# Patient Record
Sex: Male | Born: 1965 | Race: Black or African American | Hispanic: No | Marital: Married | Smoking: Current every day smoker
Health system: Southern US, Community
[De-identification: ages and names within clinical notes are randomized; demographics above are authoritative.]

## PROBLEM LIST (undated history)

## (undated) DIAGNOSIS — Z8669 Personal history of other diseases of the nervous system and sense organs: Secondary | ICD-10-CM

## (undated) DIAGNOSIS — N179 Acute kidney failure, unspecified: Secondary | ICD-10-CM

## (undated) DIAGNOSIS — F1410 Cocaine abuse, uncomplicated: Secondary | ICD-10-CM

## (undated) HISTORY — DX: Personal history of other diseases of the nervous system and sense organs: Z86.69

## (undated) HISTORY — DX: Cocaine abuse, uncomplicated: F14.10

## (undated) HISTORY — DX: Acute kidney failure, unspecified: N17.9

---

## 2005-02-25 ENCOUNTER — Emergency Department: Payer: Self-pay | Admitting: Emergency Medicine

## 2007-12-24 ENCOUNTER — Emergency Department: Payer: Self-pay | Admitting: Emergency Medicine

## 2012-09-24 ENCOUNTER — Emergency Department: Payer: Self-pay | Admitting: Emergency Medicine

## 2014-12-24 ENCOUNTER — Emergency Department
Admission: EM | Admit: 2014-12-24 | Discharge: 2014-12-24 | Disposition: A | Discharge status: Home / Self Care | Payer: Self-pay | Attending: Emergency Medicine | Admitting: Emergency Medicine

## 2014-12-24 ENCOUNTER — Encounter: Payer: Self-pay | Admitting: Emergency Medicine

## 2014-12-24 DIAGNOSIS — R0981 Nasal congestion: Secondary | ICD-10-CM

## 2014-12-24 DIAGNOSIS — J329 Chronic sinusitis, unspecified: Secondary | ICD-10-CM

## 2014-12-24 DIAGNOSIS — Z720 Tobacco use: Secondary | ICD-10-CM | POA: Insufficient documentation

## 2014-12-24 MED ORDER — PSEUDOEPHEDRINE HCL ER 120 MG PO TB12: 120.0000 mg | ORAL_TABLET | Freq: Two times a day (BID) | ORAL | Status: DC | PRN

## 2014-12-24 NOTE — Discharge Instructions (Signed)
As discussed please try out a Neti Pot (or similar device) to help clear your sinuses. Please seek medical attention for any high fevers, chest pain, shortness of breath, change in behavior, persistent vomiting, bloody stool or any other new or concerning symptoms.   Sinusitis, Adult Sinusitis is redness, soreness, and inflammation of the paranasal sinuses. Paranasal sinuses are air pockets within the bones of your face. They are located beneath your eyes, in the middle of your forehead, and above your eyes. In healthy paranasal sinuses, mucus is able to drain out, and air is able to circulate through them by way of your nose. However, when your paranasal sinuses are inflamed, mucus and air can become trapped. This can allow bacteria and other germs to grow and cause infection. Sinusitis can develop quickly and last only a short time (acute) or continue over a long period (chronic). Sinusitis that lasts for more than 12 weeks is considered chronic. CAUSES Causes of sinusitis include:  Allergies.  Structural abnormalities, such as displacement of the cartilage that separates your nostrils (deviated septum), which can decrease the air flow through your nose and sinuses and affect sinus drainage.  Functional abnormalities, such as when the small hairs (cilia) that line your sinuses and help remove mucus do not work properly or are not present. SIGNS AND SYMPTOMS Symptoms of acute and chronic sinusitis are the same. The primary symptoms are pain and pressure around the affected sinuses. Other symptoms include:  Upper toothache.  Earache.  Headache.  Bad breath.  Decreased sense of smell and taste.  A cough, which worsens when you are lying flat.  Fatigue.  Fever.  Thick drainage from your nose, which often is green and may contain pus (purulent).  Swelling and warmth over the affected sinuses. DIAGNOSIS Your health care provider will perform a physical exam. During your exam, your  health care provider may perform any of the following to help determine if you have acute sinusitis or chronic sinusitis:  Look in your nose for signs of abnormal growths in your nostrils (nasal polyps).  Tap over the affected sinus to check for signs of infection.  View the inside of your sinuses using an imaging device that has a light attached (endoscope). If your health care provider suspects that you have chronic sinusitis, one or more of the following tests may be recommended:  Allergy tests.  Nasal culture. A sample of mucus is taken from your nose, sent to a lab, and screened for bacteria.  Nasal cytology. A sample of mucus is taken from your nose and examined by your health care provider to determine if your sinusitis is related to an allergy. TREATMENT Most cases of acute sinusitis are related to a viral infection and will resolve on their own within 10 days. Sometimes, medicines are prescribed to help relieve symptoms of both acute and chronic sinusitis. These may include pain medicines, decongestants, nasal steroid sprays, or saline sprays. However, for sinusitis related to a bacterial infection, your health care provider will prescribe antibiotic medicines. These are medicines that will help kill the bacteria causing the infection. Rarely, sinusitis is caused by a fungal infection. In these cases, your health care provider will prescribe antifungal medicine. For some cases of chronic sinusitis, surgery is needed. Generally, these are cases in which sinusitis recurs more than 3 times per year, despite other treatments. HOME CARE INSTRUCTIONS  Drink plenty of water. Water helps thin the mucus so your sinuses can drain more easily.  Use a humidifier.  Inhale steam 3-4 times a day (for example, sit in the bathroom with the shower running).  Apply a warm, moist washcloth to your face 3-4 times a day, or as directed by your health care provider.  Use saline nasal sprays to help  moisten and clean your sinuses.  Take medicines only as directed by your health care provider.  If you were prescribed either an antibiotic or antifungal medicine, finish it all even if you start to feel better. SEEK IMMEDIATE MEDICAL CARE IF:  You have increasing pain or severe headaches.  You have nausea, vomiting, or drowsiness.  You have swelling around your face.  You have vision problems.  You have a stiff neck.  You have difficulty breathing.   This information is not intended to replace advice given to you by your health care provider. Make sure you discuss any questions you have with your health care provider.   Document Released: 02/20/2005 Document Revised: 03/13/2014 Document Reviewed: 03/07/2011 Elsevier Interactive Patient Education [employer]2016 [employer]vier Inc.

## 2014-12-24 NOTE — ED Provider Notes (Signed)
[facility] Emergency Department Provider Note    ____________________________________________  Time seen: 2045  I have reviewed the triage vital signs and the nursing notes.   HISTORY  Chief Complaint Nasal Congestion and Headache   History limited by: Not Limited   HPI Alan Hampton is a 49 y.o. male who presents to the emergency department with concerns for nasal congestion and facial pressure. The patient states that the symptoms started yesterday. They've gradually gotten worse. He states that the pressure is located primarily behind his eyes. It has been a pressure type. It has been constant. He is also complaining of nasal congestion. He denies any fevers. He denies any known sick contacts however states he is in contact with a lot of people at his job.   History reviewed. No pertinent past medical history.  There are no active problems to display for this patient.   History reviewed. No pertinent past surgical history.  No current outpatient prescriptions on file.  Allergies Review of patient's allergies indicates no known allergies.  No family history on file.  Social History Social History  Substance Use Topics  . Smoking status: Current Every Day Smoker -- 0.50 packs/day    Types: Cigarettes  . Smokeless tobacco: None  . Alcohol Use: No    Review of Systems  Constitutional: Negative for fever. Cardiovascular: Negative for chest pain. Respiratory: Negative for shortness of breath. Gastrointestinal: Negative for abdominal pain, vomiting and diarrhea. Genitourinary: Negative for dysuria. Musculoskeletal: Negative for back pain. Skin: Negative for rash. Neurological: Negative for headaches, focal weakness or numbness.  10-point ROS otherwise negative.  ____________________________________________   PHYSICAL EXAM:  VITAL SIGNS: ED Triage Vitals  Enc Vitals Group     BP 12/24/14 2010 114/72 mmHg     Pulse Rate 12/24/14  2010 82     Resp 12/24/14 2010 18     Temp 12/24/14 2010 97.6 F (36.4 C)     Temp Source 12/24/14 2010 Oral     SpO2 12/24/14 2010 97 %     Weight 12/24/14 2010 140 lb (63.504 kg)     Height 12/24/14 2010  (1.778 m)     Head Cir --      Peak Flow --      Pain Score 12/24/14 2010 8   Constitutional: Alert and oriented. Well appearing and in no distress. Eyes: Conjunctivae are normal. PERRL. Normal extraocular movements. ENT   Head: Normocephalic and atraumatic.   Nose: No congestion/rhinnorhea.   Mouth/Throat: Mucous membranes are moist.   Neck: No stridor. Hematological/Lymphatic/Immunilogical: No cervical lymphadenopathy. Cardiovascular: Normal rate, regular rhythm.  No murmurs, rubs, or gallops. Respiratory: Normal respiratory effort without tachypnea nor retractions. Breath sounds are clear and equal bilaterally. No wheezes/rales/rhonchi. Gastrointestinal: Soft and nontender. No distention. Genitourinary: Deferred Musculoskeletal: Normal range of motion in all extremities. No joint effusions.  No lower extremity tenderness nor edema. Neurologic:  Normal speech and language. No gross focal neurologic deficits are appreciated. Speech is normal.  Skin:  Skin is warm, dry and intact. No rash noted. Psychiatric: Mood and affect are normal. Speech and behavior are normal. Patient exhibits appropriate insight and judgment.  ____________________________________________    LABS (pertinent positives/negatives)  None  ____________________________________________   EKG  None  ____________________________________________    RADIOLOGY  None   ____________________________________________   PROCEDURES  Procedure(s) performed: None  Critical Care performed: No  ____________________________________________   INITIAL IMPRESSION / ASSESSMENT AND PLAN / ED COURSE  Pertinent labs &  imaging results that were available during my care of the patient were  reviewed by me and considered in my medical decision making (see chart for details).  Patient presents to the emergency department because of concerns for nasal congestion and pressure. Exam is consistent with sinusitis. I did discuss neti pot and irrigation with the patient. Additionally will give prescription for decongestant.  ____________________________________________   FINAL CLINICAL IMPRESSION(S) / ED DIAGNOSES  Final diagnoses:  Congestion of nasal sinus  Sinusitis, unspecified chronicity, unspecified location     Phineas SemenGraydon Wilhelmine Krogstad, MD 12/24/14 2118

## 2016-06-04 ENCOUNTER — Emergency Department
Admission: EM | Admit: 2016-06-04 | Discharge: 2016-06-04 | Disposition: A | Discharge status: Home / Self Care | Payer: Self-pay | Attending: Emergency Medicine | Admitting: Emergency Medicine

## 2016-06-04 ENCOUNTER — Encounter: Payer: Self-pay | Admitting: Emergency Medicine

## 2016-06-04 DIAGNOSIS — L821 Other seborrheic keratosis: Secondary | ICD-10-CM | POA: Insufficient documentation

## 2016-06-04 DIAGNOSIS — Z79899 Other long term (current) drug therapy: Secondary | ICD-10-CM | POA: Insufficient documentation

## 2016-06-04 DIAGNOSIS — F17210 Nicotine dependence, cigarettes, uncomplicated: Secondary | ICD-10-CM | POA: Insufficient documentation

## 2016-06-04 MED ORDER — TRIAMCINOLONE ACETONIDE 0.5 % EX OINT: 1.0000 "application " | TOPICAL_OINTMENT | Freq: Two times a day (BID) | CUTANEOUS | 0 refills | Status: DC

## 2016-06-04 MED ORDER — DIPHENHYDRAMINE HCL 25 MG PO CAPS: 25.0000 mg | ORAL_CAPSULE | ORAL | 0 refills | Status: DC | PRN

## 2016-06-04 NOTE — ED Provider Notes (Signed)
[facility] Emergency Department Provider Note   ____________________________________________   First MD Initiated Contact with Patient 06/04/16 [phone]     (approximate)  I have reviewed the triage vital signs and the nursing notes.   HISTORY  Chief Complaint Back Pain    HPI Alan Hampton is a 51 y.o. male who comes into the hospital today with an area on his back that won't heal. He reports that he had a tick on his back and he pulled it off. He reports that since then the place is gotten worse. This occurred about 3-4 months ago but the area is still sore and there is a scab that spared. He reports that he's been meaning to come in but he just hadn't. He used some cortisone and some Neosporin on it as well as rubbing alcohol. He reports that the area is dry and it itches really badly. It is not draining but nothing is making healed up completely. Any fevers, nausea, vomiting. He rates pain 5 out of 10 in intensity. This area is in the middle of his back.   History reviewed. No pertinent past medical history.  There are no active problems to display for this patient.   History reviewed. No pertinent surgical history.  Prior to Admission medications   Medication Sig Start Date End Date Taking? Authorizing Provider  diphenhydrAMINE (BENADRYL) 25 mg capsule Take 1 capsule (25 mg total) by mouth every 4 (four) hours as needed. 06/04/16 06/04/17  Rebecka Apley, MD  pseudoephedrine (SUDAFED) 120 MG 12 hr tablet Take 1 tablet (120 mg total) by mouth 2 (two) times daily as needed for congestion. 12/24/14   Phineas Semen, MD  triamcinolone ointment (KENALOG) 0.5 % Apply 1 application topically 2 (two) times daily. 06/04/16   Rebecka Apley, MD    Allergies Patient has no known allergies.  No family history on file.  Social History Social History  Substance Use Topics  . Smoking status: Current Every Day Smoker    Packs/day: 0.50    Types: Cigarettes    . Smokeless tobacco: Not on file  . Alcohol use No    Review of Systems Constitutional: No fever/chills Eyes: No visual changes. ENT: No sore throat. Cardiovascular: Denies chest pain. Respiratory: Denies shortness of breath. Gastrointestinal: No abdominal pain.  No nausea, no vomiting.  No diarrhea.  No constipation. Genitourinary: Negative for dysuria. Musculoskeletal: Negative for back pain. Skin: lesion to mid back Neurological: Negative for headaches, focal weakness or numbness.  10-point ROS otherwise negative.  ____________________________________________   PHYSICAL EXAM:  VITAL SIGNS: ED Triage Vitals  Enc Vitals Group     BP 06/04/16 0657 102/66     Pulse Rate 06/04/16 0657 70     Resp 06/04/16 0657 18     Temp --      Temp src --      SpO2 06/04/16 0657 98 %     Weight 06/04/16 0128 140 lb (63.5 kg)     Height 06/04/16 0128  (1.778 m)     Head Circumference --      Peak Flow --      Pain Score 06/04/16 0128 9     Pain Loc --      Pain Edu? --      Excl. in GC? --     Constitutional: Alert and oriented. Well appearing and in mild distress. Eyes: Conjunctivae are normal. PERRL. EOMI. Head: Atraumatic. Nose: No congestion/rhinnorhea. Mouth/Throat: Mucous membranes  are moist.  Oropharynx non-erythematous. Cardiovascular: Normal rate, regular rhythm. Grossly normal heart sounds.  Good peripheral circulation. Respiratory: Normal respiratory effort.  No retractions. Lungs CTAB. Gastrointestinal: Soft and nontender. No distention. Positive bowel sounds Musculoskeletal: No lower extremity tenderness nor edema.   Neurologic:  Normal speech and language.  Skin:  Scaly papule to mid back with no drainage, no erythema, no fluctuance Psychiatric: Mood and affect are normal. Speech and behavior are normal.  ____________________________________________   LABS (all labs ordered are listed, but only abnormal results are displayed)  Labs Reviewed - No data  to display ____________________________________________  EKG  none ____________________________________________  RADIOLOGY  none ____________________________________________   PROCEDURES  Procedure(s) performed: None  Procedures  Critical Care performed: No  ____________________________________________   INITIAL IMPRESSION / ASSESSMENT AND PLAN / ED COURSE  Pertinent labs & imaging results that were available during my care of the patient were reviewed by me and considered in my medical decision making (see chart for details).  This is a 51 year old male who comes into the hospital today with a lesion to the middle of his back. It is been there for about 3-4 months. Looking at this lesion that appears to be a seborrheic keratosis. It is scaly and the papule. The patient needs to have this biopsied by dermatology as well as removed by them. I will give the patient some Benadryl and I will give him a prescription for some triamcinolone cream but he does need to follow back up with dermatology. The patient otherwise has no other concerns. He'll be discharged home.      ____________________________________________   FINAL CLINICAL IMPRESSION(S) / ED DIAGNOSES  Final diagnoses:  Seborrheic keratoses      NEW MEDICATIONS STARTED DURING THIS VISIT:  New Prescriptions   DIPHENHYDRAMINE (BENADRYL) 25 MG CAPSULE    Take 1 capsule (25 mg total) by mouth every 4 (four) hours as needed.   TRIAMCINOLONE OINTMENT (KENALOG) 0.5 %    Apply 1 application topically 2 (two) times daily.     Note:  This document was prepared using Dragon voice recognition software and may include unintentional dictation errors.    Rebecka Apley, MD 06/04/16 [phone]

## 2016-06-04 NOTE — ED Triage Notes (Signed)
Patient reports having back pain "for a minute" (when ask to explain reports 5-6 months).  Patient states removed a tick from his back back in the summer and it has never healed.

## 2016-06-04 NOTE — Discharge Instructions (Signed)
Please follow up with dermatology. The lesion will need to be biopsied and removed for further evaluation.

## 2016-06-04 NOTE — ED Notes (Signed)
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 Pt denies drainage from the area or fevers. Pt states the "itching and pain keeps getting worse" and "I've tried everything neosporin, hydrocortisone cream and nothing helps." Pt states he is unsure if he removed tick head when he pulled it off. Raised area to pt's central mid back with multiple scratch marks noted.

## 2017-01-08 ENCOUNTER — Emergency Department
Admission: EM | Admit: 2017-01-08 | Discharge: 2017-01-08 | Disposition: A | Discharge status: Home / Self Care | Payer: Self-pay | Attending: Emergency Medicine | Admitting: Emergency Medicine

## 2017-01-08 ENCOUNTER — Emergency Department: Payer: Self-pay

## 2017-01-08 DIAGNOSIS — S56922A Laceration of unspecified muscles, fascia and tendons at forearm level, left arm, initial encounter: Secondary | ICD-10-CM | POA: Insufficient documentation

## 2017-01-08 DIAGNOSIS — Y9389 Activity, other specified: Secondary | ICD-10-CM | POA: Insufficient documentation

## 2017-01-08 DIAGNOSIS — T148XXA Other injury of unspecified body region, initial encounter: Secondary | ICD-10-CM

## 2017-01-08 DIAGNOSIS — S0181XA Laceration without foreign body of other part of head, initial encounter: Secondary | ICD-10-CM

## 2017-01-08 DIAGNOSIS — Y929 Unspecified place or not applicable: Secondary | ICD-10-CM | POA: Insufficient documentation

## 2017-01-08 DIAGNOSIS — F17200 Nicotine dependence, unspecified, uncomplicated: Secondary | ICD-10-CM | POA: Insufficient documentation

## 2017-01-08 DIAGNOSIS — W25XXXA Contact with sharp glass, initial encounter: Secondary | ICD-10-CM | POA: Insufficient documentation

## 2017-01-08 DIAGNOSIS — Z23 Encounter for immunization: Secondary | ICD-10-CM | POA: Insufficient documentation

## 2017-01-08 DIAGNOSIS — Y998 Other external cause status: Secondary | ICD-10-CM | POA: Insufficient documentation

## 2017-01-08 MED ORDER — LIDOCAINE-EPINEPHRINE (PF) 2 %-1:200000 IJ SOLN
10.0000 mL | Freq: Once | INTRAMUSCULAR | Status: DC
  Filled 2017-01-08: qty 10

## 2017-01-08 MED ORDER — DTAP-HEPATITIS B RECOMB-IPV IM SUSP: 0.5000 mL | Freq: Once | INTRAMUSCULAR | Status: DC

## 2017-01-08 MED ORDER — TETANUS-DIPHTH-ACELL PERTUSSIS 5-2.5-18.5 LF-MCG/0.5 IM SUSP
0.5000 mL | Freq: Once | INTRAMUSCULAR | Status: AC
  Administered 2017-01-08: 0.5 mL via INTRAMUSCULAR
  Filled 2017-01-08: qty 0.5

## 2017-01-08 MED ORDER — CEPHALEXIN 500 MG PO CAPS
500.0000 mg | ORAL_CAPSULE | Freq: Once | ORAL | Status: AC
  Administered 2017-01-08: 500 mg via ORAL
  Filled 2017-01-08: qty 1

## 2017-01-08 MED ORDER — CEPHALEXIN 500 MG PO CAPS: 500.0000 mg | ORAL_CAPSULE | Freq: Four times a day (QID) | ORAL | 0 refills | Status: AC

## 2017-01-08 NOTE — ED Triage Notes (Signed)
Patient reports he cut his left arm on screen door. Patient has 3" laceration to left arm. Patient does not remember when his last tetanus shot was.

## 2017-01-08 NOTE — ED Provider Notes (Signed)
[facility] Emergency Department Provider Note   ____________________________________________   None    (approximate)  I have reviewed the triage vital signs and the nursing notes.   HISTORY  Chief Complaint Laceration    HPI Alan Hampton is a 51 y.o. male reports no significant medical history, reports that he was getting his dog in this evening when he got tangled up with the dog, and fell forward and his hand went through the glass on the front of his screen door.  He reports that bled and is painful, he has a cut over the left forearm.  Denies any numbness tingling or weakness in the hand.  Does not take any blood thinners.  Denies any other injury.  Unsure when his last tetanus shot was  Reports a stinging pain, especially in the area where the laceration is.  Denies any numbness or tingling.  History reviewed. No pertinent past medical history.  There are no active problems to display for this patient.   History reviewed. No pertinent surgical history.  Takes no medications  Allergies Patient has no known allergies.  No family history on file.  Social History Social History   Tobacco Use  . Smoking status: Current Every Day Smoker    Packs/day: 0.50    Types: Cigarettes  . Smokeless tobacco: Never Used  Substance Use Topics  . Alcohol use: No  . Drug use: No    Review of Systems Constitutional: No fever/chills Eyes: No visual changes. ENT: No sore throat. Cardiovascular: Denies chest pain. Respiratory: Denies shortness of breath. Gastrointestinal: No abdominal pain.  No nausea, no vomiting.  No diarrhea.  No constipation. Genitourinary: Negative for dysuria. Musculoskeletal: Negative for back pain. Skin: See HPI Neurological: Negative for headaches, focal weakness or numbness.    ____________________________________________   PHYSICAL EXAM:  VITAL SIGNS: ED Triage Vitals  Enc Vitals Group     BP 01/08/17 0249  125/76     Pulse Rate 01/08/17 0249 (!) 102     Resp 01/08/17 0249 17     Temp 01/08/17 0249 98.2 F (36.8 C)     Temp Source 01/08/17 0249 Oral     SpO2 01/08/17 0249 100 %     Weight 01/08/17 0246 140 lb (63.5 kg)     Height 01/08/17 0246 5\' 10"  (1.778 m)     Head Circumference --      Peak Flow --      Pain Score 01/08/17 0246 9     Pain Loc --      Pain Edu? --      Excl. in GC? --     Constitutional: Alert and oriented. Well appearing and in no acute distress. Eyes: Conjunctivae are normal. Head: Atraumatic. Nose: No congestion/rhinnorhea. Mouth/Throat: Mucous membranes are moist. Neck: No stridor.   Cardiovascular: Normal rate, regular rhythm. Grossly normal heart sounds.  Good peripheral circulation. Respiratory: Normal respiratory effort.  No retractions. Lungs CTAB. Gastrointestinal: Soft and nontender. No distention. Musculoskeletal:  Right hand Median, ulnar, radial motor intact. Cap refill less than 2 seconds all digits. Strong radial pulse. 5 out of 5 strength throughout the hand intrinsics, flexion and extension at the wrist.  There is an approximately 3 inch linear laceration without obvious foreign body over the left lateral forearm with bleeding controlled.  Upon ample irrigation, did discover on close examination of the patient does appear to have a muscle body injury with a laceration that is somewhat unclear if it is  through and through over approximately the region of the extensor carpi ulnaris region, extension of the wrist in flexion of the wrist is normal, but the muscle is seen to slide back and forth and may represent a partial injury or felt less likely a complete laceration of the muscle body.   Left hand Median, ulnar, radial motor intact. Cap refill less than 2 seconds all digits. Strong radial pulse. 5 out of 5 strength throughout the hand intrinsics, flexion and extension at the wrist. No evidence of  trauma. ____________________________________________   Neurologic:  Normal speech and language. No gross focal neurologic deficits are appreciated.  Skin:  Skin is warm, dry and intact. No rash noted. Psychiatric: Mood and affect are normal. Speech and behavior are normal.  ____________________________________________   LABS (all labs ordered are listed, but only abnormal results are displayed)  Labs Reviewed - No data to display ____________________________________________  EKG   ____________________________________________  RADIOLOGY  X-ray demonstrates no acute finding or foreign body ____________________________________________   PROCEDURES  Procedure(s) performed: Left forearm laceration  LACERATION REPAIR Performed by: Sharyn Creamer Authorized by: Sharyn Creamer Consent: Verbal consent obtained. Risks and benefits: risks, benefits and alternatives were discussed Consent given by: patient Patient identity confirmed: provided demographic data Prepped and Draped in normal sterile fashion Wound explored  Laceration Location: Left forearm  Laceration Length: 7 cm  No Foreign Bodies seen or palpated  Anesthesia: local infiltration  Local anesthetic: lidocaine with epinephrine  Anesthetic total: 7 ml  Irrigation method: syringe Amount of cleaning: standard  Skin closure: 4-0prolene  Number of sutures: 7  Technique: Complex, required 1 vertical mattress and 6 simple interrupted  Patient tolerance: Patient tolerated the procedure well with no immediate complications.  Procedures  Critical Care performed: Yes, see critical care note(s)  ____________________________________________   INITIAL IMPRESSION / ASSESSMENT AND PLAN / ED COURSE  Pertinent labs & imaging results that were available during my care of the patient were reviewed by me and considered in my medical decision making (see chart for details).  Patient presents for isolated injury of the  left forearm.  He has a linear laceration with intact distal neurovascular and musculoskeletal exam.  Bleeding is well controlled.  Plan to repair, update tetanus,  Clinical Course as of Jan 09 443  Mon Jan 08, 2017  1610 Paged ortho  [MQ]  [phone] Case and care discussed with Dr. Lucius Conn (ortho). Recommend oral antibiotic and follow-up in clinic. Refer to Carmon Ginsberg (Dr. Rosita Kea) recommended by Dr. Rosita Kea.   [MQ]    Clinical Course User Index [MQ] Sharyn Creamer, MD   ----------------------------------------- 3:39 AM on 01/08/2017 -----------------------------------------  [employer] interviewing patient.  Patient reports to me laceration from injury on a glass screen door.  I was not present during the interview with the detective but the detective did ask me about the laceration, not reported it was straight in about 2-1/2 inches to 3 inches in length.  Return precautions and treatment recommendations and follow-up discussed with the patient who is agreeable with the plan.    ____________________________________________   FINAL CLINICAL IMPRESSION(S) / ED DIAGNOSES  Final diagnoses:  Laceration of forehead, initial encounter  Muscle laceration      NEW MEDICATIONS STARTED DURING THIS VISIT:  This SmartLink is deprecated. Use AVSMEDLIST instead to display the medication list for a patient.   Note:  This document was prepared using Dragon voice recognition software and may include unintentional dictation errors.     Sharyn Creamer,  MD 01/08/17 [id number]

## 2017-01-08 NOTE — Discharge Instructions (Signed)
As we discussed, I am suspicious that you have an injury to the muscle underneath your cut. To follow-up closely with orthopedics this week, please follow-up with Dr. Rosita KeaMenz.   You have been seen in the Emergency Department (ED) today for a laceration (cut).  Please keep the cut clean but do not submerge it in the water.  It has been repaired with staples or sutures that will need to be removed in about 10 days. Please follow up with your doctor, an urgent care, or return to the ED for suture removal.  Please use your antibiotics as prescribed.  Please take Tylenol (acetaminophen) or Motrin (ibuprofen) as needed for discomfort as written on the box.   Please follow up with your doctor as soon as possible regarding today's emergent visit.   Return to the ED or call your doctor if you notice any signs of infection such as fever, increased pain, increased redness, pus, or other symptoms that concern you.

## 2017-01-08 NOTE — ED Notes (Signed)
Reports cut his arm on his screen door when it went through the glass while he was dealing with his dog.  Dressing applied in triage no bleeding through at this time.

## 2019-12-22 ENCOUNTER — Other Ambulatory Visit: Payer: Self-pay

## 2019-12-22 ENCOUNTER — Emergency Department: Payer: Self-pay

## 2019-12-22 ENCOUNTER — Encounter: Payer: Self-pay | Admitting: *Deleted

## 2019-12-22 DIAGNOSIS — R4182 Altered mental status, unspecified: Secondary | ICD-10-CM | POA: Insufficient documentation

## 2019-12-22 DIAGNOSIS — F1490 Cocaine use, unspecified, uncomplicated: Secondary | ICD-10-CM | POA: Insufficient documentation

## 2019-12-22 DIAGNOSIS — E7220 Disorder of urea cycle metabolism, unspecified: Secondary | ICD-10-CM | POA: Insufficient documentation

## 2019-12-22 DIAGNOSIS — F17210 Nicotine dependence, cigarettes, uncomplicated: Secondary | ICD-10-CM | POA: Insufficient documentation

## 2019-12-22 DIAGNOSIS — J189 Pneumonia, unspecified organism: Secondary | ICD-10-CM | POA: Insufficient documentation

## 2019-12-22 LAB — BASIC METABOLIC PANEL
Anion gap: 11 (ref 5–15)
BUN: 14 mg/dL (ref 6–20)
CO2: 24 mmol/L (ref 22–32)
Calcium: 8.7 mg/dL — ABNORMAL LOW (ref 8.9–10.3)
Chloride: 106 mmol/L (ref 98–111)
Creatinine, Ser: 1.13 mg/dL (ref 0.61–1.24)
GFR, Estimated: >60 mL/min (ref >60)
Glucose, Bld: 108 mg/dL — ABNORMAL HIGH (ref 70–99)
Potassium: 3.6 mmol/L (ref 3.5–5.1)
Sodium: 141 mmol/L (ref 135–145)

## 2019-12-22 LAB — CBC
HCT: 39.7 % (ref 39.0–52.0)
Hemoglobin: 13.1 g/dL (ref 13.0–17.0)
MCH: 31.2 pg (ref 26.0–34.0)
MCHC: 33 g/dL (ref 30.0–36.0)
MCV: 94.5 fL (ref 80.0–100.0)
Platelets: 188 10*3/uL (ref 150–400)
RBC: 4.2 MIL/uL — ABNORMAL LOW (ref 4.22–5.81)
RDW: 12.6 % (ref 11.5–15.5)
WBC: 7.7 10*3/uL (ref 4.0–10.5)
nRBC: 0 % (ref 0.0–0.2)

## 2019-12-22 LAB — TROPONIN I (HIGH SENSITIVITY): Troponin I (High Sensitivity): 4 ng/L (ref <18)

## 2019-12-22 NOTE — ED Triage Notes (Signed)
Pt to ED reporting lefts sided chest pain starting today. No radiation and no known injury. Slight amount of SOB reported but pt denies feeling increased WOB or cough. No lightheadedness or dizziness. Pt denies cardiac hx. Pt having trouble focusing in triage and appears to befalling asleep while talking to nurse but denies taking medications at home and when asked about behavior pt reports "it is just because of the pain."

## 2019-12-23 ENCOUNTER — Encounter: Payer: Self-pay | Admitting: Radiology

## 2019-12-23 ENCOUNTER — Emergency Department: Payer: Self-pay

## 2019-12-23 ENCOUNTER — Emergency Department
Admission: EM | Admit: 2019-12-23 | Discharge: 2019-12-23 | Disposition: A | Discharge status: Home / Self Care | Payer: Self-pay | Attending: Emergency Medicine | Admitting: Emergency Medicine

## 2019-12-23 DIAGNOSIS — R4182 Altered mental status, unspecified: Secondary | ICD-10-CM

## 2019-12-23 DIAGNOSIS — F1490 Cocaine use, unspecified, uncomplicated: Secondary | ICD-10-CM

## 2019-12-23 DIAGNOSIS — R079 Chest pain, unspecified: Secondary | ICD-10-CM

## 2019-12-23 DIAGNOSIS — J189 Pneumonia, unspecified organism: Secondary | ICD-10-CM

## 2019-12-23 DIAGNOSIS — E7220 Disorder of urea cycle metabolism, unspecified: Secondary | ICD-10-CM

## 2019-12-23 LAB — URINE DRUG SCREEN, QUALITATIVE (ARMC ONLY)
Amphetamines, Ur Screen: NOT DETECTED
Barbiturates, Ur Screen: NOT DETECTED
Benzodiazepine, Ur Scrn: NOT DETECTED
Cannabinoid 50 Ng, Ur ~~LOC~~: NOT DETECTED
Cocaine Metabolite,Ur ~~LOC~~: POSITIVE — AB
MDMA (Ecstasy)Ur Screen: NOT DETECTED
Methadone Scn, Ur: NOT DETECTED
Opiate, Ur Screen: NOT DETECTED
Phencyclidine (PCP) Ur S: NOT DETECTED
Tricyclic, Ur Screen: NOT DETECTED

## 2019-12-23 LAB — HEPATIC FUNCTION PANEL
ALT: 13 U/L (ref 0–44)
AST: 18 U/L (ref 15–41)
Albumin: 4 g/dL (ref 3.5–5.0)
Alkaline Phosphatase: 54 U/L (ref 38–126)
Bilirubin, Direct: <0.1 mg/dL (ref 0.0–0.2)
Total Bilirubin: 0.6 mg/dL (ref 0.3–1.2)
Total Protein: 7.4 g/dL (ref 6.5–8.1)

## 2019-12-23 LAB — BLOOD GAS, ARTERIAL
Acid-base deficit: 1.6 mmol/L (ref 0.0–2.0)
Bicarbonate: 22.9 mmol/L (ref 20.0–28.0)
FIO2: 0.21
O2 Saturation: 97.7 %
Patient temperature: 37
pCO2 arterial: 37 mmHg (ref 32.0–48.0)
pH, Arterial: 7.4 (ref 7.350–7.450)
pO2, Arterial: 100 mmHg (ref 83.0–108.0)

## 2019-12-23 LAB — LIPASE, BLOOD: Lipase: 29 U/L (ref 11–51)

## 2019-12-23 LAB — CK: Total CK: 234 U/L (ref 49–397)

## 2019-12-23 LAB — ETHANOL: Alcohol, Ethyl (B): <10 mg/dL (ref <10)

## 2019-12-23 LAB — AMMONIA: Ammonia: 51 umol/L — ABNORMAL HIGH (ref 9–35)

## 2019-12-23 LAB — TROPONIN I (HIGH SENSITIVITY): Troponin I (High Sensitivity): 2 ng/L (ref <18)

## 2019-12-23 MED ORDER — IOHEXOL 350 MG/ML SOLN
75.0000 mL | Freq: Once | INTRAVENOUS | Status: AC | PRN
  Administered 2019-12-23: 75 mL via INTRAVENOUS

## 2019-12-23 MED ORDER — LACTULOSE 10 GM/15ML PO SOLN
30.0000 g | Freq: Once | ORAL | Status: AC
  Administered 2019-12-23: 30 g via ORAL
  Filled 2019-12-23: qty 60

## 2019-12-23 MED ORDER — SODIUM CHLORIDE 0.9 % IV BOLUS
1000.0000 mL | Freq: Once | INTRAVENOUS | Status: AC
  Administered 2019-12-23: 1000 mL via INTRAVENOUS

## 2019-12-23 MED ORDER — AZITHROMYCIN 250 MG PO TABS: ORAL_TABLET | ORAL | 0 refills | Status: DC

## 2019-12-23 NOTE — Discharge Instructions (Addendum)
Stop using illicit drugs. Take antibiotic as prescribed (ZPack). Follow up with your PCP to recheck ammonia level in 3-5 days. Return to the ER for worsening symptoms, persistent vomiting, difficulty breathing or other concerns.

## 2019-12-23 NOTE — ED Notes (Signed)
Pt girlfriend bedside.

## 2019-12-23 NOTE — ED Notes (Signed)
Pt in and out cathed per EDP verbal order. Pt woke up briefely to explain procedure to pt. Pt agreed to cath.  Mac, RN assisted this RN with in and out. 100 mL urine obtained and sent to lab.

## 2019-12-23 NOTE — ED Notes (Addendum)
Pt sitting in lobby sleeping soundly; does not awaken when name called several times; EDT over to awaken pt and pt does not respond to sternal rub; pt flutters eyes slightly open with 2nd sternal rub; resp even/unlab; pt's aunt enters lobby and requested her assistance with pt; aunt calls pt name loudly and shakes him several times firmly until pt awakens and was able to position self in w/c; taken to triage for reassessment; pt denies any drug or alcohol use, remains sleepy and falls back asleep during reassessment

## 2019-12-23 NOTE — ED Provider Notes (Signed)
[facility] Emergency Department Provider Note   ____________________________________________   First MD Initiated Contact with Patient 12/23/19 0119     (approximate)  I have reviewed the triage vital signs and the nursing notes.   HISTORY  Chief Complaint Chest Pain  Level V caveat: limited by somnolence  HPI Alan Hampton is a 54 y.o. male who presents to the emergency department from home with a chief complaint of left-sided chest pain. Reports onset yesterday. Denies know injury or radiation. Denies fever, cough, sob, abdominal pain, nausea, vomiting or dizziness. Patient denies drug use; states he has not slept. Was extremely difficult to arouse and therefore brought back to a treatment room.     Past Medical History None  There are no problems to display for this patient.   History reviewed. No pertinent surgical history.  Prior to Admission medications   Medication Sig Start Date End Date Taking? Authorizing Provider  azithromycin (ZITHROMAX Z-PAK) 250 MG tablet Take 2 pills day 1, then 1 pill daily until finished 12/23/19   Irean Hong, MD    Allergies Patient has no known allergies.  History reviewed. No pertinent family history.  Social History Social History   Tobacco Use  . Smoking status: Current Every Day Smoker    Packs/day: 0.50    Types: Cigarettes  . Smokeless tobacco: Never Used  Substance Use Topics  . Alcohol use: No  . Drug use: No    Review of Systems  Constitutional: No fever/chills Eyes: No visual changes. ENT: No sore throat. Cardiovascular: Positive for chest pain. Respiratory: Denies shortness of breath. Gastrointestinal: No abdominal pain.  No nausea, no vomiting.  No diarrhea.  No constipation. Genitourinary: Negative for dysuria. Musculoskeletal: Negative for back pain. Skin: Negative for rash. Neurological: Negative for headaches, focal weakness or  numbness.   ____________________________________________   PHYSICAL EXAM:  VITAL SIGNS: ED Triage Vitals  Enc Vitals Group     BP 12/22/19 2121 (!) 139/100     Pulse Rate 12/22/19 2121 (!) 105     Resp 12/22/19 2121 16     Temp 12/22/19 2121 99.6 F (37.6 C)     Temp Source 12/22/19 2121 Oral     SpO2 12/22/19 2121 100 %     Weight 12/22/19 2122 130 lb (59 kg)     Height 12/22/19 2122  (1.778 m)     Head Circumference --      Peak Flow --      Pain Score 12/22/19 2121 10     Pain Loc --      Pain Edu? --      Excl. in GC? --     Constitutional: Alert and oriented.  Somnolent appearing and in no acute distress. Eyes: Conjunctivae are normal. PERRL. EOMI. Head: Atraumatic. Nose: No congestion/rhinnorhea. Mouth/Throat: Mucous membranes are moist.   Neck: No stridor.   Cardiovascular: Normal rate, regular rhythm. Grossly normal heart sounds.  Good peripheral circulation. Respiratory: Normal respiratory effort.  No retractions. Lungs CTAB.  Left lower ribs tender to palpation.  No splinting.  No crepitus. Gastrointestinal: Soft and nontender to light or deep palpation. No distention. No abdominal bruits. No CVA tenderness. Musculoskeletal: No lower extremity tenderness nor edema.  No joint effusions. Neurologic: Somnolent but arousable briefly to voice. No gross focal neurologic deficits are appreciated. MAEx4. Skin:  Skin is warm, dry and intact. No rash noted. Psychiatric: Unable to assess. ____________________________________________   LABS (all labs ordered are listed,  but only abnormal results are displayed)  Labs Reviewed  BASIC METABOLIC PANEL - Abnormal; Notable for the following components:      Result Value   Glucose, Bld 108 (*)    Calcium 8.7 (*)    All other components within normal limits  CBC - Abnormal; Notable for the following components:   RBC 4.20 (*)    All other components within normal limits  URINE DRUG SCREEN, QUALITATIVE ([facility] ONLY) -  Abnormal; Notable for the following components:   Cocaine Metabolite,Ur [place] POSITIVE (*)    All other components within normal limits  AMMONIA - Abnormal; Notable for the following components:   Ammonia 51 (*)    All other components within normal limits  HEPATIC FUNCTION PANEL  LIPASE, BLOOD  ETHANOL  BLOOD GAS, ARTERIAL  CK  TROPONIN I (HIGH SENSITIVITY)  TROPONIN I (HIGH SENSITIVITY)   ____________________________________________  EKG  ED ECG REPORT I, Delaila Nand J, the attending physician, personally viewed and interpreted this ECG.   Date: 12/23/2019  EKG Time: 2117  Rate: 102  Rhythm: sinus tachycardia  Axis: Normal  Intervals:none  ST&T Change: Nonspecific  ____________________________________________  RADIOLOGY I, Giavana Rooke J, personally viewed and evaluated these images (plain radiographs) as part of my medical decision making, as well as reviewing the written report by the radiologist.  ED MD interpretation: No acute cardiopulmonary process; CT head unremarkable, CT chest/abdomen/pelvis demonstrates no PE, possible lingular infectious etiology  Official radiology report(s): DG Chest 2 View  Result Date: 12/22/2019 CLINICAL DATA:  Left-sided chest pain starting today. Mild shortness of breath. EXAM: CHEST - 2 VIEW COMPARISON:  None. FINDINGS: Hyperinflation. Normal heart size and pulmonary vascularity. No focal airspace disease or consolidation in the lungs. No blunting of costophrenic angles. No pneumothorax. Mediastinal contours appear intact. IMPRESSION: No active cardiopulmonary disease. Electronically Signed   By: Burman Nieves M.D.   On: 12/22/2019 22:05   CT Head Wo Contrast  Result Date: 12/23/2019 CLINICAL DATA:  54 year old male with altered mental status. Chest pain. EXAM: CT HEAD WITHOUT CONTRAST TECHNIQUE: Contiguous axial images were obtained from the base of the skull through the vertex without intravenous contrast. COMPARISON:  None. FINDINGS:  Brain: No midline shift, ventriculomegaly, mass effect, evidence of mass lesion, intracranial hemorrhage or evidence of cortically based acute infarction. Gray-white matter differentiation is within normal limits throughout the brain. Cerebral volume is within normal limits for age. Vascular: No suspicious intracranial vascular hyperdensity. Skull: No acute osseous abnormality identified. Sinuses/Orbits: Visualized paranasal sinuses and mastoids are clear. Other: No acute orbit or scalp soft tissue findings. IMPRESSION: Normal for age non contrast CT appearance of the brain. Electronically Signed   By: Odessa Fleming M.D.   On: 12/23/2019 02:45   CT Angio Chest PE W/Cm &/Or Wo Cm  Result Date: 12/23/2019 CLINICAL DATA:  54 year old male with chest and abdominal pain. Concern for pulmonary embolism. EXAM: CT ANGIOGRAPHY CHEST CT ABDOMEN AND PELVIS WITH CONTRAST TECHNIQUE: Multidetector CT imaging of the chest was performed using the standard protocol during bolus administration of intravenous contrast. Multiplanar CT image reconstructions and MIPs were obtained to evaluate the vascular anatomy. Multidetector CT imaging of the abdomen and pelvis was performed using the standard protocol during bolus administration of intravenous contrast. CONTRAST:  29mL OMNIPAQUE IOHEXOL 350 MG/ML SOLN COMPARISON:  Chest radiograph dated 12/22/2019. FINDINGS: Evaluation is limited due to respiratory motion artifact as well as streak artifact caused by patient's arms. CTA CHEST FINDINGS Cardiovascular: There is no cardiomegaly or pericardial effusion.  The thoracic aorta is unremarkable. The origins of the great vessels of the aortic arch appear patent. No pulmonary artery embolus identified. Mediastinum/Nodes: No hilar adenopathy. Subcarinal lymph node measures 15 mm in short axis. The esophagus and the thyroid gland are grossly unremarkable. No mediastinal fluid collection. Lungs/Pleura: There is a focal area of consolidative change in  the lingula, likely infectious or inflammatory in etiology. Underlying mass is not excluded. Clinical correlation and follow-up to resolution recommended. A pulmonary infarct is less likely in the absence of pulmonary embolism. There is no pleural effusion or pneumothorax. The central airways are patent. Musculoskeletal: No chest wall abnormality. No acute or significant osseous findings. Review of the MIP images confirms the above findings. CT ABDOMEN and PELVIS FINDINGS No intra-abdominal free air or free fluid. Hepatobiliary: Subcentimeter hypodense focus in the anterior liver is too small to characterize. The liver is otherwise unremarkable. No intrahepatic biliary ductal dilatation. The gallbladder is contracted. No calcified gallstone. Pancreas: Unremarkable. No pancreatic ductal dilatation or surrounding inflammatory changes. Spleen: Normal in size without focal abnormality. Adrenals/Urinary Tract: The adrenal glands unremarkable. There is no hydronephrosis on either side. There is symmetric enhancement and excretion of contrast by both kidneys. Small bilateral renal hypodense lesions are too small to characterize. The visualized ureters and urinary bladder appear unremarkable. Stomach/Bowel: There is moderate stool throughout the colon. There is no bowel obstruction or active inflammation. The appendix is normal. Vascular/Lymphatic: The abdominal aorta and IVC unremarkable. No portal venous gas. There is no adenopathy. Reproductive: The prostate gland is mildly enlarged. The seminal vesicles are symmetric. No pelvic mass. Other: There is paucity of mesenteric and subcutaneous fat. Musculoskeletal: Degenerative changes of the spine. No acute osseous pathology. Review of the MIP images confirms the above findings. IMPRESSION: 1. No CT evidence of pulmonary embolism or aortic dissection. 2. Focal area of consolidative change in the lingula, likely infectious or inflammatory in etiology. Underlying mass is not  excluded. Clinical correlation and follow-up to resolution recommended. 3. No acute intra-abdominal or pelvic pathology. Electronically Signed   By: Elgie CollardArash  Radparvar M.D.   On: 12/23/2019 02:53   CT Abdomen Pelvis W Contrast  Result Date: 12/23/2019 CLINICAL DATA:  54 year old male with chest and abdominal pain. Concern for pulmonary embolism. EXAM: CT ANGIOGRAPHY CHEST CT ABDOMEN AND PELVIS WITH CONTRAST TECHNIQUE: Multidetector CT imaging of the chest was performed using the standard protocol during bolus administration of intravenous contrast. Multiplanar CT image reconstructions and MIPs were obtained to evaluate the vascular anatomy. Multidetector CT imaging of the abdomen and pelvis was performed using the standard protocol during bolus administration of intravenous contrast. CONTRAST:  75mL OMNIPAQUE IOHEXOL 350 MG/ML SOLN COMPARISON:  Chest radiograph dated 12/22/2019. FINDINGS: Evaluation is limited due to respiratory motion artifact as well as streak artifact caused by patient's arms. CTA CHEST FINDINGS Cardiovascular: There is no cardiomegaly or pericardial effusion. The thoracic aorta is unremarkable. The origins of the great vessels of the aortic arch appear patent. No pulmonary artery embolus identified. Mediastinum/Nodes: No hilar adenopathy. Subcarinal lymph node measures 15 mm in short axis. The esophagus and the thyroid gland are grossly unremarkable. No mediastinal fluid collection. Lungs/Pleura: There is a focal area of consolidative change in the lingula, likely infectious or inflammatory in etiology. Underlying mass is not excluded. Clinical correlation and follow-up to resolution recommended. A pulmonary infarct is less likely in the absence of pulmonary embolism. There is no pleural effusion or pneumothorax. The central airways are patent. Musculoskeletal: No chest wall abnormality. No  acute or significant osseous findings. Review of the MIP images confirms the above findings. CT ABDOMEN  and PELVIS FINDINGS No intra-abdominal free air or free fluid. Hepatobiliary: Subcentimeter hypodense focus in the anterior liver is too small to characterize. The liver is otherwise unremarkable. No intrahepatic biliary ductal dilatation. The gallbladder is contracted. No calcified gallstone. Pancreas: Unremarkable. No pancreatic ductal dilatation or surrounding inflammatory changes. Spleen: Normal in size without focal abnormality. Adrenals/Urinary Tract: The adrenal glands unremarkable. There is no hydronephrosis on either side. There is symmetric enhancement and excretion of contrast by both kidneys. Small bilateral renal hypodense lesions are too small to characterize. The visualized ureters and urinary bladder appear unremarkable. Stomach/Bowel: There is moderate stool throughout the colon. There is no bowel obstruction or active inflammation. The appendix is normal. Vascular/Lymphatic: The abdominal aorta and IVC unremarkable. No portal venous gas. There is no adenopathy. Reproductive: The prostate gland is mildly enlarged. The seminal vesicles are symmetric. No pelvic mass. Other: There is paucity of mesenteric and subcutaneous fat. Musculoskeletal: Degenerative changes of the spine. No acute osseous pathology. Review of the MIP images confirms the above findings. IMPRESSION: 1. No CT evidence of pulmonary embolism or aortic dissection. 2. Focal area of consolidative change in the lingula, likely infectious or inflammatory in etiology. Underlying mass is not excluded. Clinical correlation and follow-up to resolution recommended. 3. No acute intra-abdominal or pelvic pathology. Electronically Signed   By: Elgie Collard M.D.   On: 12/23/2019 02:53    ____________________________________________   PROCEDURES  Procedure(s) performed (including Critical Care):  .1-3 Lead EKG Interpretation Performed by: Irean Hong, MD Authorized by: Irean Hong, MD     Interpretation: normal     ECG rate:   95   ECG rate assessment: normal     Rhythm: sinus rhythm     Ectopy: none     Conduction: normal   Comments:     Patient placed on cardiac monitor to evaluate for arrythmias     ____________________________________________   INITIAL IMPRESSION / ASSESSMENT AND PLAN / ED COURSE  As part of my medical decision making, I reviewed the following data within the electronic MEDICAL RECORD NUMBER History obtained from family, Nursing notes reviewed and incorporated, Labs reviewed, EKG interpreted, Old chart reviewed, Radiograph reviewed and Notes from prior ED visits     54 year old male presenting with left-sided chest pain; somnolent and difficult to arouse. Differential diagnosis includes, but is not limited to, alcohol, illicit or prescription medications, or other toxic ingestion; intracranial pathology such as stroke or intracerebral hemorrhage; fever or infectious causes including sepsis; hypoxemia and/or hypercarbia; uremia; trauma; endocrine related disorders such as diabetes, hypoglycemia, and thyroid-related diseases; hypertensive encephalopathy; etc.  Initial troponin and EKG unremarkable. Patient awoke briefly to voice to answers questions, now back asleep. Will add toxicological work-up including EtOH, UDS, ammonia. Obtain CT head, chest/abd pel. Initiate IV fluid hydration and reassess.   Clinical Course as of Dec 22 549  Tue Dec 23, 2019  0235 Girlfriend at bedside who endorses patient's drug use. UDS positive for cocaine. Noted mildly elevated ammonia which is likely not the sole contributory reason for patient's somnolence; will administer Lactulose. Awaiting CT scan results.   [JS]  [phone] Girlfriend able to wake patient up briefly by tickling his beard.  Again suspect somnolence secondary to coming off of cocaine use.  Will continue to monitor in the ED until patient is more awake.   [JS]  0550 Patient awake, took lactulose.  Used urinal, asking for a drink.  Updated him of all  test and imaging results.  Will discharge home with azithromycin.  Strict return precautions given.  Patient and girlfriend verbalized understanding agree with plan of care.   [JS]    Clinical Course User Index [JS] Irean Hong, MD     ____________________________________________   FINAL CLINICAL IMPRESSION(S) / ED DIAGNOSES  Final diagnoses:  Nonspecific chest pain  Altered mental status, unspecified altered mental status type  Cocaine use  Hyperammonemia (HCC)  Community acquired pneumonia, unspecified laterality     ED Discharge Orders         Ordered    azithromycin (ZITHROMAX Z-PAK) 250 MG tablet        12/23/19 0444          *Please note:  Alan Hampton was evaluated in Emergency Department on 12/23/2019 for the symptoms described in the history of present illness. He was evaluated in the context of the global COVID-19 pandemic, which necessitated consideration that the patient might be at risk for infection with the SARS-CoV-2 virus that causes COVID-19. Institutional protocols and algorithms that pertain to the evaluation of patients at risk for COVID-19 are in a state of rapid change based on information released by regulatory bodies including the CDC and federal and state organizations. These policies and algorithms were followed during the patient's care in the ED.  Some ED evaluations and interventions may be delayed as a result of limited staffing during and the pandemic.*   Note:  This document was prepared using Dragon voice recognition software and may include unintentional dictation errors.   Irean Hong, MD 12/23/19 [phone]

## 2020-06-07 ENCOUNTER — Emergency Department: Payer: Self-pay

## 2020-06-07 ENCOUNTER — Other Ambulatory Visit: Payer: Self-pay

## 2020-06-07 ENCOUNTER — Observation Stay
Admission: EM | Admit: 2020-06-07 | Discharge: 2020-06-09 | Disposition: A | Discharge status: Home / Self Care | Payer: Self-pay | Attending: Internal Medicine | Admitting: Internal Medicine

## 2020-06-07 DIAGNOSIS — N179 Acute kidney failure, unspecified: Secondary | ICD-10-CM | POA: Insufficient documentation

## 2020-06-07 DIAGNOSIS — R6510 Systemic inflammatory response syndrome (SIRS) of non-infectious origin without acute organ dysfunction: Secondary | ICD-10-CM

## 2020-06-07 DIAGNOSIS — Z20822 Contact with and (suspected) exposure to covid-19: Secondary | ICD-10-CM | POA: Insufficient documentation

## 2020-06-07 DIAGNOSIS — G9340 Encephalopathy, unspecified: Secondary | ICD-10-CM

## 2020-06-07 DIAGNOSIS — Y900 Blood alcohol level of less than 20 mg/100 ml: Secondary | ICD-10-CM | POA: Insufficient documentation

## 2020-06-07 DIAGNOSIS — F17210 Nicotine dependence, cigarettes, uncomplicated: Secondary | ICD-10-CM | POA: Insufficient documentation

## 2020-06-07 DIAGNOSIS — G9341 Metabolic encephalopathy: Secondary | ICD-10-CM | POA: Insufficient documentation

## 2020-06-07 DIAGNOSIS — A419 Sepsis, unspecified organism: Principal | ICD-10-CM | POA: Insufficient documentation

## 2020-06-07 DIAGNOSIS — Z28310 Unvaccinated for covid-19: Secondary | ICD-10-CM | POA: Insufficient documentation

## 2020-06-07 DIAGNOSIS — F1490 Cocaine use, unspecified, uncomplicated: Secondary | ICD-10-CM

## 2020-06-07 DIAGNOSIS — F1420 Cocaine dependence, uncomplicated: Secondary | ICD-10-CM | POA: Insufficient documentation

## 2020-06-07 DIAGNOSIS — R509 Fever, unspecified: Secondary | ICD-10-CM

## 2020-06-07 LAB — COMPREHENSIVE METABOLIC PANEL
ALT: 15 U/L (ref 0–44)
AST: 21 U/L (ref 15–41)
Albumin: 3.6 g/dL (ref 3.5–5.0)
Alkaline Phosphatase: 52 U/L (ref 38–126)
Anion gap: 8 (ref 5–15)
BUN: 15 mg/dL (ref 6–20)
CO2: 20 mmol/L — ABNORMAL LOW (ref 22–32)
Calcium: 8.5 mg/dL — ABNORMAL LOW (ref 8.9–10.3)
Chloride: 105 mmol/L (ref 98–111)
Creatinine, Ser: 1.51 mg/dL — ABNORMAL HIGH (ref 0.61–1.24)
GFR, Estimated: 55 mL/min — ABNORMAL LOW (ref >60)
Glucose, Bld: 125 mg/dL — ABNORMAL HIGH (ref 70–99)
Potassium: 4.2 mmol/L (ref 3.5–5.1)
Sodium: 133 mmol/L — ABNORMAL LOW (ref 135–145)
Total Bilirubin: 1.3 mg/dL — ABNORMAL HIGH (ref 0.3–1.2)
Total Protein: 7.8 g/dL (ref 6.5–8.1)

## 2020-06-07 LAB — CBC
HCT: 39.6 % (ref 39.0–52.0)
Hemoglobin: 13.2 g/dL (ref 13.0–17.0)
MCH: 30.8 pg (ref 26.0–34.0)
MCHC: 33.3 g/dL (ref 30.0–36.0)
MCV: 92.3 fL (ref 80.0–100.0)
Platelets: 276 10*3/uL (ref 150–400)
RBC: 4.29 MIL/uL (ref 4.22–5.81)
RDW: 12.1 % (ref 11.5–15.5)
WBC: 18.2 10*3/uL — ABNORMAL HIGH (ref 4.0–10.5)
nRBC: 0 % (ref 0.0–0.2)

## 2020-06-07 LAB — CBG MONITORING, ED: Glucose-Capillary: 128 mg/dL — ABNORMAL HIGH (ref 70–99)

## 2020-06-07 LAB — TROPONIN I (HIGH SENSITIVITY): Troponin I (High Sensitivity): 4 ng/L (ref <18)

## 2020-06-07 LAB — AMMONIA: Ammonia: 32 umol/L (ref 9–35)

## 2020-06-07 LAB — ETHANOL: Alcohol, Ethyl (B): <10 mg/dL (ref <10)

## 2020-06-07 MED ORDER — ACETAMINOPHEN 650 MG RE SUPP
650.0000 mg | Freq: Once | RECTAL | Status: AC
  Administered 2020-06-07: 650 mg via RECTAL
  Filled 2020-06-07: qty 1

## 2020-06-07 MED ORDER — AMMONIA AROMATIC IN INHA
1.0000 | Freq: Once | RESPIRATORY_TRACT | Status: AC
  Administered 2020-06-07: 1 via RESPIRATORY_TRACT
  Filled 2020-06-07: qty 10

## 2020-06-07 MED ORDER — LACTATED RINGERS IV BOLUS
1000.0000 mL | Freq: Once | INTRAVENOUS | Status: AC
  Administered 2020-06-07: 1000 mL via INTRAVENOUS

## 2020-06-07 MED ORDER — VANCOMYCIN HCL IN DEXTROSE 1-5 GM/200ML-% IV SOLN
1000.0000 mg | Freq: Once | INTRAVENOUS | Status: AC
  Administered 2020-06-08: 1000 mg via INTRAVENOUS
  Filled 2020-06-07: qty 200

## 2020-06-07 MED ORDER — SODIUM CHLORIDE 0.9 % IV SOLN
2.0000 g | Freq: Once | INTRAVENOUS | Status: AC
  Administered 2020-06-08: 2 g via INTRAVENOUS
  Filled 2020-06-07: qty 2

## 2020-06-07 NOTE — ED Triage Notes (Signed)
Pt in with co lethargy since yesterday, family states he "just gets tired". Pt has hx of the same and was dx with pneumonia. Pt denies any complaints at this tired, states "just sleepy".

## 2020-06-08 ENCOUNTER — Observation Stay: Payer: Self-pay

## 2020-06-08 ENCOUNTER — Encounter: Payer: Self-pay | Admitting: Internal Medicine

## 2020-06-08 ENCOUNTER — Emergency Department: Payer: Self-pay

## 2020-06-08 DIAGNOSIS — A419 Sepsis, unspecified organism: Secondary | ICD-10-CM | POA: Diagnosis present

## 2020-06-08 DIAGNOSIS — G9341 Metabolic encephalopathy: Secondary | ICD-10-CM

## 2020-06-08 DIAGNOSIS — N179 Acute kidney failure, unspecified: Secondary | ICD-10-CM

## 2020-06-08 DIAGNOSIS — F1490 Cocaine use, unspecified, uncomplicated: Secondary | ICD-10-CM

## 2020-06-08 LAB — CBC WITH DIFFERENTIAL/PLATELET
Abs Immature Granulocytes: 0.27 10*3/uL — ABNORMAL HIGH (ref 0.00–0.07)
Basophils Absolute: 0.1 10*3/uL (ref 0.0–0.1)
Basophils Relative: 0 %
Eosinophils Absolute: 0 10*3/uL (ref 0.0–0.5)
Eosinophils Relative: 0 %
HCT: 42.8 % (ref 39.0–52.0)
Hemoglobin: 13.9 g/dL (ref 13.0–17.0)
Immature Granulocytes: 1 %
Lymphocytes Relative: 5 %
Lymphs Abs: 1.1 10*3/uL (ref 0.7–4.0)
MCH: 30.4 pg (ref 26.0–34.0)
MCHC: 32.5 g/dL (ref 30.0–36.0)
MCV: 93.7 fL (ref 80.0–100.0)
Monocytes Absolute: 2 10*3/uL — ABNORMAL HIGH (ref 0.1–1.0)
Monocytes Relative: 10 %
Neutro Abs: 17.6 10*3/uL — ABNORMAL HIGH (ref 1.7–7.7)
Neutrophils Relative %: 84 %
Platelets: 251 10*3/uL (ref 150–400)
RBC: 4.57 MIL/uL (ref 4.22–5.81)
RDW: 12.1 % (ref 11.5–15.5)
Smear Review: NORMAL
WBC: 21 10*3/uL — ABNORMAL HIGH (ref 4.0–10.5)
nRBC: 0 % (ref 0.0–0.2)

## 2020-06-08 LAB — CBC
HCT: 36.2 % — ABNORMAL LOW (ref 39.0–52.0)
Hemoglobin: 11.9 g/dL — ABNORMAL LOW (ref 13.0–17.0)
MCH: 30.7 pg (ref 26.0–34.0)
MCHC: 32.9 g/dL (ref 30.0–36.0)
MCV: 93.3 fL (ref 80.0–100.0)
Platelets: 251 10*3/uL (ref 150–400)
RBC: 3.88 MIL/uL — ABNORMAL LOW (ref 4.22–5.81)
RDW: 12.3 % (ref 11.5–15.5)
WBC: 21.9 10*3/uL — ABNORMAL HIGH (ref 4.0–10.5)
nRBC: 0 % (ref 0.0–0.2)

## 2020-06-08 LAB — RESP PANEL BY RT-PCR (FLU A&B, COVID) ARPGX2
Influenza A by PCR: NEGATIVE
Influenza B by PCR: NEGATIVE
SARS Coronavirus 2 by RT PCR: NEGATIVE

## 2020-06-08 LAB — RESPIRATORY PANEL BY PCR

## 2020-06-08 LAB — CSF CELL COUNT WITH DIFFERENTIAL
Eosinophils, CSF: 0 %
Lymphs, CSF: 94 %
Monocyte-Macrophage-Spinal Fluid: 6 %
RBC Count, CSF: 33 /mm3 — ABNORMAL HIGH (ref 0–3)
Segmented Neutrophils-CSF: 0 %
Tube #: 3
WBC, CSF: 5 /mm3 (ref 0–5)

## 2020-06-08 LAB — URINE DRUG SCREEN, QUALITATIVE (ARMC ONLY)
Amphetamines, Ur Screen: NOT DETECTED
Barbiturates, Ur Screen: NOT DETECTED
Benzodiazepine, Ur Scrn: NOT DETECTED
Cannabinoid 50 Ng, Ur ~~LOC~~: POSITIVE — AB
Cocaine Metabolite,Ur ~~LOC~~: POSITIVE — AB
MDMA (Ecstasy)Ur Screen: NOT DETECTED
Methadone Scn, Ur: NOT DETECTED
Opiate, Ur Screen: NOT DETECTED
Phencyclidine (PCP) Ur S: NOT DETECTED
Tricyclic, Ur Screen: NOT DETECTED

## 2020-06-08 LAB — LACTIC ACID, PLASMA: Lactic Acid, Venous: 1.9 mmol/L (ref 0.5–1.9)

## 2020-06-08 LAB — PROTIME-INR
INR: 1.2 (ref 0.8–1.2)
Prothrombin Time: 15.1 seconds (ref 11.4–15.2)

## 2020-06-08 LAB — URINALYSIS, ROUTINE W REFLEX MICROSCOPIC
Bacteria, UA: NONE SEEN
Bilirubin Urine: NEGATIVE
Glucose, UA: NEGATIVE mg/dL
Ketones, ur: 5 mg/dL — AB
Leukocytes,Ua: NEGATIVE
Nitrite: NEGATIVE
Protein, ur: 100 mg/dL — AB
Specific Gravity, Urine: 1.03 (ref 1.005–1.030)
pH: 5 (ref 5.0–8.0)

## 2020-06-08 LAB — BASIC METABOLIC PANEL
Anion gap: 7 (ref 5–15)
BUN: 16 mg/dL (ref 6–20)
CO2: 22 mmol/L (ref 22–32)
Calcium: 8.5 mg/dL — ABNORMAL LOW (ref 8.9–10.3)
Chloride: 108 mmol/L (ref 98–111)
Creatinine, Ser: 1.3 mg/dL — ABNORMAL HIGH (ref 0.61–1.24)
GFR, Estimated: >60 mL/min (ref >60)
Glucose, Bld: 117 mg/dL — ABNORMAL HIGH (ref 70–99)
Potassium: 4.2 mmol/L (ref 3.5–5.1)
Sodium: 137 mmol/L (ref 135–145)

## 2020-06-08 LAB — HIV ANTIBODY (ROUTINE TESTING W REFLEX): HIV Screen 4th Generation wRfx: NONREACTIVE

## 2020-06-08 LAB — PROTEIN, CSF: Total  Protein, CSF: 89 mg/dL — ABNORMAL HIGH (ref 15–45)

## 2020-06-08 LAB — HEPATITIS C ANTIBODY: HCV Ab: NONREACTIVE

## 2020-06-08 LAB — CORTISOL-AM, BLOOD: Cortisol - AM: 12.5 ug/dL (ref 6.7–22.6)

## 2020-06-08 LAB — PROCALCITONIN: Procalcitonin: 6.18 ng/mL

## 2020-06-08 LAB — TROPONIN I (HIGH SENSITIVITY): Troponin I (High Sensitivity): 3 ng/L (ref <18)

## 2020-06-08 LAB — GLUCOSE, CSF: Glucose, CSF: 67 mg/dL (ref 40–70)

## 2020-06-08 MED ORDER — VANCOMYCIN HCL 1250 MG/250ML IV SOLN: 1250.0000 mg | INTRAVENOUS | Status: DC

## 2020-06-08 MED ORDER — VANCOMYCIN HCL 750 MG/150ML IV SOLN
750.0000 mg | Freq: Two times a day (BID) | INTRAVENOUS | Status: DC
  Filled 2020-06-08: qty 150

## 2020-06-08 MED ORDER — ACETAMINOPHEN 325 MG PO TABS: 650.0000 mg | ORAL_TABLET | Freq: Four times a day (QID) | ORAL | Status: DC | PRN

## 2020-06-08 MED ORDER — ENOXAPARIN SODIUM 40 MG/0.4ML ~~LOC~~ SOLN: 40.0000 mg | SUBCUTANEOUS | Status: DC

## 2020-06-08 MED ORDER — ONDANSETRON HCL 4 MG PO TABS: 4.0000 mg | ORAL_TABLET | Freq: Four times a day (QID) | ORAL | Status: DC | PRN

## 2020-06-08 MED ORDER — VANCOMYCIN HCL 750 MG IV SOLR
750.0000 mg | Freq: Two times a day (BID) | INTRAVENOUS | Status: DC
  Administered 2020-06-08 – 2020-06-09 (×2): 750 mg via INTRAVENOUS
  Filled 2020-06-08 (×4): qty 750

## 2020-06-08 MED ORDER — ONDANSETRON HCL 4 MG/2ML IJ SOLN: 4.0000 mg | Freq: Four times a day (QID) | INTRAMUSCULAR | Status: DC | PRN

## 2020-06-08 MED ORDER — ENOXAPARIN SODIUM 40 MG/0.4ML ~~LOC~~ SOLN
40.0000 mg | SUBCUTANEOUS | Status: DC
  Administered 2020-06-08: 11:00:00 40 mg via SUBCUTANEOUS

## 2020-06-08 MED ORDER — SODIUM CHLORIDE 0.9 % IV SOLN
2.0000 g | Freq: Three times a day (TID) | INTRAVENOUS | Status: DC
  Filled 2020-06-08 (×2): qty 2

## 2020-06-08 MED ORDER — SODIUM CHLORIDE 0.9 % IV SOLN: 2.0000 g | Freq: Two times a day (BID) | INTRAVENOUS | Status: DC

## 2020-06-08 MED ORDER — SODIUM CHLORIDE 0.9 % IV SOLN
2.0000 g | Freq: Two times a day (BID) | INTRAVENOUS | Status: DC
  Administered 2020-06-08: 2 g via INTRAVENOUS
  Filled 2020-06-08 (×2): qty 20

## 2020-06-08 MED ORDER — ACETAMINOPHEN 650 MG RE SUPP: 650.0000 mg | Freq: Four times a day (QID) | RECTAL | Status: DC | PRN

## 2020-06-08 MED ORDER — VANCOMYCIN HCL 500 MG/100ML IV SOLN
500.0000 mg | Freq: Once | INTRAVENOUS | Status: AC
  Administered 2020-06-08: 05:00:00 500 mg via INTRAVENOUS
  Filled 2020-06-08: qty 100

## 2020-06-08 MED ORDER — SODIUM CHLORIDE (PF) 0.9 % IJ SOLN
10.0000 mL | Freq: Once | INTRAMUSCULAR | Status: AC
  Administered 2020-06-08: 10 mL

## 2020-06-08 MED ORDER — SODIUM CHLORIDE 0.9 % IV SOLN
2.0000 g | INTRAVENOUS | Status: DC
  Filled 2020-06-08: qty 20

## 2020-06-08 MED ORDER — METRONIDAZOLE IN NACL 5-0.79 MG/ML-% IV SOLN
500.0000 mg | Freq: Three times a day (TID) | INTRAVENOUS | Status: DC
  Administered 2020-06-08 – 2020-06-09 (×5): 500 mg via INTRAVENOUS
  Filled 2020-06-08 (×8): qty 100

## 2020-06-08 MED ORDER — SODIUM CHLORIDE 0.9 % IV SOLN: INTRAVENOUS | Status: DC

## 2020-06-08 MED ORDER — LIDOCAINE HCL (PF) 1 % IJ SOLN
15.0000 mL | Freq: Once | INTRAMUSCULAR | Status: AC
  Administered 2020-06-08: 11:00:00 15 mL
  Filled 2020-06-08: qty 15

## 2020-06-08 NOTE — Progress Notes (Signed)
Pharmacy Antibiotic Note  Alan Hampton is a 55 y.o. male admitted on 06/07/2020 with sepsis from unknown source.  Pharmacy has been consulted for Cefepime and Vancomycin dosing. Pt also ordered Flagyl 500 mg IV q8h  Plan: Pt SCr currently 1.51 (CrCl 51.4), likely AKI.  AM labs already ordered for 06/08/20.  Since pt given initial doses of both meds in ER, will ordered additional dose of Vancomycin 500 mg per pt wt 63.5kg once, then recheck SCr prior to dosing/ordering scheduled Cefepime and Vancomycin.  0405 0513 SCr Improved to 1.3, CrCl now ~ 60 (59.7)  Due to improvement in CrCl, ordering Cefepime 2 gm q8h per indication and renal fxn.  Vancomycin: Pt ordered total LD of Vanc 1500 mg per pt wt 63.5 kg Vancomycin 1250 mg IV Q 24 hrs.  Goal AUC 400-550. Expected AUC: 506.8 SCr used: 1.3, TBW 63.5 < IBW 73.0  Pharmacy will continue to follow SCr closely and adjust abx dosing if warranted.  Height: 5\' 10"  (177.8 cm) Weight: 63.5 kg (140 lb) IBW/kg (Calculated) : 73  Temp (24hrs), Avg:102.1 F (38.9 C), Min:100.6 F (38.1 C), Max:103.6 F (39.8 C)  Recent Labs  Lab 06/07/20 2101 06/07/20 2335  WBC 18.2* 21.0*  CREATININE 1.51*  --   LATICACIDVEN  --  1.9    Estimated Creatinine Clearance: 51.4 mL/min (A) (by C-G formula based on SCr of 1.51 mg/dL (H)).    No Known Allergies  Antimicrobials this admission: 04/05 Cefepime >>  04/05 Vancomycin >>  04/05 Flagyl >>  Microbiology results: 04/04 BCx: Pending 04/04 UCx: Pending   Thank you for allowing pharmacy to be a part of this patient's care.  06/04, PharmD, [facility] 06/08/2020 2:51 AM

## 2020-06-08 NOTE — ED Provider Notes (Addendum)
[facility] Emergency Department Provider Note  ____________________________________________   Event Date/Time   First MD Initiated Contact with Patient 06/07/20 2301     (approximate)  I have reviewed the triage vital signs and the nursing notes.   HISTORY  Chief Complaint Fatigue    HPI Alan Hampton is a 55 y.o. male no significant past medical history who presents to the emergency department with fatigue that started yesterday.  Significant other reports that he had subjective fever today, cough, vomiting.  No known diarrhea.  Patient reported to triage nurse that he just felt tired and weak.  Patient uncooperative with history at this time.  Arouses to sternal rub but then falls back asleep.  Significant other reports he is not vaccinated against COVID-19 or influenza.  They report similar symptoms when he had pneumonia previously.        No past medical history on file.  There are no problems to display for this patient.   No past surgical history on file.  Prior to Admission medications   Medication Sig Start Date End Date Taking? Authorizing Provider  azithromycin (ZITHROMAX Z-PAK) 250 MG tablet Take 2 pills day 1, then 1 pill daily until finished 12/23/19   Irean HongSung, Jade J, MD    Allergies Patient has no known allergies.  No family history on file.  Social History Social History   Tobacco Use  . Smoking status: Current Every Day Smoker    Packs/day: 0.50    Types: Cigarettes  . Smokeless tobacco: Never Used  Substance Use Topics  . Alcohol use: No  . Drug use: No    Review of Systems Level 5 caveat due to patient's poor cooperation  ____________________________________________   PHYSICAL EXAM:  VITAL SIGNS: ED Triage Vitals  Enc Vitals Group     BP 06/07/20 2058 110/65     Pulse Rate 06/07/20 2058 (!) 127     Resp 06/07/20 2058 20     Temp 06/07/20 2346 (!) 103.6 F (39.8 C)     Temp Source 06/07/20 2346 Rectal      SpO2 06/07/20 2058 94 %     Weight 06/07/20 2056 140 lb (63.5 kg)     Height 06/07/20 2056 5\' 10"  (1.778 m)     Head Circumference --      Peak Flow --      Pain Score 06/07/20 2055 0     Pain Loc --      Pain Edu? --      Excl. in GC? --    CONSTITUTIONAL: Alert and oriented and responds appropriately to questions.  Febrile.  Nontoxic-appearing. HEAD: Normocephalic EYES: Conjunctivae clear, pupils appear equal, EOM appear intact ENT: normal nose; moist mucous membranes NECK: Supple, normal ROM, no meningismus CARD: Regular and tachycardic; S1 and S2 appreciated; no murmurs, no clicks, no rubs, no gallops RESP: Normal chest excursion without splinting or tachypnea; breath sounds clear and equal bilaterally; no wheezes, no rhonchi, no rales, no hypoxia or respiratory distress, speaking full sentences ABD/GI: Normal bowel sounds; non-distended; soft, non-tender, no rebound, no guarding, no peritoneal signs, no hepatosplenomegaly BACK: The back appears normal EXT: Normal ROM in all joints; no deformity noted, no edema; no cyanosis SKIN: Normal color for age and race; warm; no rash on exposed skin NEURO: Moves all extremities equally, somnolent but arousable to painful stimuli, opens eyes spontaneously, does not answer questions but does awaken with ammonia inhalant  ____________________________________________   LABS (all labs ordered  are listed, but only abnormal results are displayed)  Labs Reviewed  CBC - Abnormal; Notable for the following components:      Result Value   WBC 18.2 (*)    All other components within normal limits  COMPREHENSIVE METABOLIC PANEL - Abnormal; Notable for the following components:   Sodium 133 (*)    CO2 20 (*)    Glucose, Bld 125 (*)    Creatinine, Ser 1.51 (*)    Calcium 8.5 (*)    Total Bilirubin 1.3 (*)    GFR, Estimated 55 (*)    All other components within normal limits  URINE DRUG SCREEN, QUALITATIVE ([facility] ONLY) - Abnormal; Notable for the  following components:   Cocaine Metabolite,Ur [place] POSITIVE (*)    Cannabinoid 50 Ng, Ur [place] POSITIVE (*)    All other components within normal limits  URINALYSIS, ROUTINE W REFLEX MICROSCOPIC - Abnormal; Notable for the following components:   Color, Urine AMBER (*)    APPearance CLOUDY (*)    Hgb urine dipstick MODERATE (*)    Ketones, ur 5 (*)    Protein, ur 100 (*)    All other components within normal limits  CBC WITH DIFFERENTIAL/PLATELET - Abnormal; Notable for the following components:   WBC 21.0 (*)    Neutro Abs 17.6 (*)    Monocytes Absolute 2.0 (*)    Abs Immature Granulocytes 0.27 (*)    All other components within normal limits  CBG MONITORING, ED - Abnormal; Notable for the following components:   Glucose-Capillary 128 (*)    All other components within normal limits  RESP PANEL BY RT-PCR (FLU A&B, COVID) ARPGX2  URINE CULTURE  CULTURE, BLOOD (ROUTINE X 2)  CULTURE, BLOOD (ROUTINE X 2)  ETHANOL  AMMONIA  LACTIC ACID, PLASMA  TROPONIN I (HIGH SENSITIVITY)  TROPONIN I (HIGH SENSITIVITY)   ____________________________________________  EKG   EKG Interpretation  Date/Time:  Monday June 07 2020 20:56:53 EDT Ventricular Rate:  125 PR Interval:  130 QRS Duration: 70 QT Interval:  266 QTC Calculation: 383 R Axis:   71 Text Interpretation: Sinus tachycardia with Premature atrial complexes Minimal voltage criteria for LVH, may be normal variant ( Sokolow-Lyon ) Nonspecific ST and T wave abnormality Abnormal ECG Confirmed by Rochele Raring [phone]) on 06/07/2020 11:13:46 PM       ____________________________________________  RADIOLOGY Normajean Baxter [place], personally viewed and evaluated these images (plain radiographs) as part of my medical decision making, as well as reviewing the written report by the radiologist.  ED MD interpretation:  CXR clear.  Official radiology report(s): DG Chest Portable 1 View  Result Date: 06/08/2020 CLINICAL DATA:  Fever, cough EXAM:  PORTABLE CHEST 1 VIEW COMPARISON:  12/22/2019 FINDINGS: Heart and mediastinal contours are within normal limits. No focal opacities or effusions. No acute bony abnormality. IMPRESSION: No active disease. Electronically Signed   By: Charlett Nose M.D.   On: 06/08/2020 00:18    ____________________________________________   PROCEDURES  Procedure(s) performed (including Critical Care):  Procedures  CRITICAL CARE Performed by: Baxter Hire Therisa Mennella   Total critical care time: 65 minutes  Critical care time was exclusive of separately billable procedures and treating other patients.  Critical care was necessary to treat or prevent imminent or life-threatening deterioration.  Critical care was time spent personally by me on the following activities: development of treatment plan with patient and/or surrogate as well as nursing, discussions with consultants, evaluation of patient's response to treatment, examination of patient, obtaining history from patient  or surrogate, ordering and performing treatments and interventions, ordering and review of laboratory studies, ordering and review of radiographic studies, pulse oximetry and re-evaluation of patient's condition.  ____________________________________________   INITIAL IMPRESSION / ASSESSMENT AND PLAN / ED COURSE  As part of my medical decision making, I reviewed the following data within the electronic MEDICAL RECORD NUMBER History obtained from family, Nursing notes reviewed and incorporated, Labs reviewed , EKG interpreted , Old EKG reviewed, Radiograph reviewed , Discussed with admitting physician  and Notes from prior ED visits         Patient here with fevers, cough, vomiting.  Initial work-up obtained in triage shows leukocytosis of 18,000 and minimally elevated creatinine of 1.25.  Normal troponin, negative ethanol level and normal ammonia.  I am concerned for sepsis given he is tachycardic and rectal temperature confirms a temperature of  103.6.  Differential includes COVID-19, influenza, pneumonia, UTI, bacteremia, meningitis, encephalitis.  He is poorly cooperative with exam and history but does awaken with an ammonia inhalant.  He is somnolent and arouses to sternal rub.  Chest x-ray, urine, cultures pending.  Will give IV fluids, broad-spectrum antibiotics, Tylenol.  Will also add on urine drug screen.  ED PROGRESS  1:25 AM  On reevaluation, patient is now more awake, oriented x3.  States he has not been feeling well for 2 days.  No headaches, neck pain, neck stiffness, chest pain, shortness of breath.  Reports he has had vomiting and diarrhea but no abdominal pain.  He states he just feels very tired.  He is still tachycardic here despite IV fluids, antipyretics.  Labs show leukocytosis now 21,000 with left shift.  Chest x-ray clear.  Urine does have 6-10 white blood cells and 6-10 red blood cells but no bacteria.  Culture is pending.  Lactate is normal.  Covid and flu negative.  Drug screen is positive for cocaine and cannabinoids.  He denies any history of IV drug use.  Cultures pending.  He has received 30 mL/kg IV fluid bolus and broad-spectrum antibiotics.  I do recommend admission for observation given he meets sirs criteria.  I do not feel he has meningitis or needs a lumbar puncture at this time but given his history of drug use I feel he has high risk for bacteremia.  Will discuss with hospitalist.   2:02 AM Discussed patient's case with hospitalist, Dr. Para March.  I have recommended admission and patient (and family if present) agree with this plan. Admitting physician will place admission orders.   I reviewed all nursing notes, vitals, pertinent previous records and reviewed/interpreted all EKGs, lab and urine results, imaging (as available).   ____________________________________________   FINAL CLINICAL IMPRESSION(S) / ED DIAGNOSES  Final diagnoses:  Fever, unspecified fever cause  SIRS (systemic inflammatory  response syndrome) [facility])     ED Discharge Orders    None      *Please note:  Alan Hampton was evaluated in Emergency Department on 06/08/2020 for the symptoms described in the history of present illness. He was evaluated in the context of the global COVID-19 pandemic, which necessitated consideration that the patient might be at risk for infection with the SARS-CoV-2 virus that causes COVID-19. Institutional protocols and algorithms that pertain to the evaluation of patients at risk for COVID-19 are in a state of rapid change based on information released by regulatory bodies including the CDC and federal and state organizations. These policies and algorithms were followed during the patient's care in the ED.  Some  ED evaluations and interventions may be delayed as a result of limited staffing during and the pandemic.*   Note:  This document was prepared using Dragon voice recognition software and may include unintentional dictation errors.   Massa Pe, Layla Maw, DO 06/08/20 0203    Bobby Barton, Layla Maw, DO 06/08/20 3817

## 2020-06-08 NOTE — ED Notes (Signed)
X-ray at bedside

## 2020-06-08 NOTE — Plan of Care (Signed)

## 2020-06-08 NOTE — H&P (Addendum)
History and Physical    Alan Hampton [facility] IOE:[id number] DOB: 05/16/1965 DOA: 06/07/2020  PCP: Patient, No Pcp Per (Inactive)   Patient coming from: Home  I have personally briefly reviewed patient's old medical records in [facility]  Chief Complaint: Malaise, altered mental status  HPI: Alan Hampton is a 55 y.o. male with no significant past medical history who presents with a 1 day history of fatigue and generalized malaise, subjective fever, cough and an episode of vomiting.  Unable to obtain history from patient due to somnolence, and thus taken from ER provider and documented records thus far  ED Course: On arrival to the emergency room patient was initially very somnolent difficult to arouse and history had to be taken from significant other.  On arrival temperature 103.6, pulse 127, BP 110/65 respirations 20 with O2 sat 94% on room air.  Blood work significant for WBC 21,000, with a lactic acid 1.9 creatinine 1.5.  EtOH, ammonia and troponin all within normal limits.  Urinalysis without pyuria.  Urine drug screen positive for cocaine and cannabinoids.  COVID and flu swabs negative. EKG as reviewed by me : Sinus tachycardia at 125 with nonspecific ST-T wave changes Imaging: Chest x-ray no active disease  Patient was treated w with ammonia salts in the ED initially due to being difficult to arouse.  He subsequently came around.  After work-up was completed he was started on sepsis fluids and antibiotics for sepsis of unknown source.  Hospitalist consulted for admission for sepsis versus SlRS.  Review of Systems: Unable to obtain due to somnolence   History reviewed. No pertinent past medical history.  History reviewed. No pertinent surgical history.   reports that he has been smoking cigarettes. He has been smoking about 0.50 packs per day. He has never used smokeless tobacco. He reports that he does not drink alcohol and does not use drugs.  No Known Allergies  History reviewed.  No pertinent family history.    Prior to Admission medications   Medication Sig Start Date End Date Taking? Authorizing Provider  azithromycin (ZITHROMAX Z-PAK) 250 MG tablet Take 2 pills day 1, then 1 pill daily until finished 12/23/19   Irean Hong, MD    Physical Exam: Vitals:   06/08/20 0138 06/08/20 0230 06/08/20 0246 06/08/20 0322  BP:  115/76 124/88 124/85  Pulse:  (!) 108 99 96  Resp:  20 20 18   Temp: (!) 100.6 F (38.1 C)  99.2 F (37.3 C) 98.9 F (37.2 C)  TempSrc: Rectal  Oral   SpO2:  97% 100% 99%  Weight:      Height:         Vitals:   06/08/20 0138 06/08/20 0230 06/08/20 0246 06/08/20 0322  BP:  115/76 124/88 124/85  Pulse:  (!) 108 99 96  Resp:  20 20 18   Temp: (!) 100.6 F (38.1 C)  99.2 F (37.3 C) 98.9 F (37.2 C)  TempSrc: Rectal  Oral   SpO2:  97% 100% 99%  Weight:      Height:          Constitutional:  Somnolent, difficult to arouse, might open eyes and then readily falls back asleep. Not in any apparent distress HEENT:      Head: Normocephalic and atraumatic.         Eyes: PERLA, EOMI, Conjunctivae are normal. Sclera is non-icteric.       Mouth/Throat: Mucous membranes are moist.       Neck: Supple  with no signs of meningismus. Cardiovascular: Regular rate and rhythm. No murmurs, gallops, or rubs. 2+ symmetrical distal pulses are present . No JVD. No LE edema Respiratory: Respiratory effort normal .Lungs sounds clear bilaterally. No wheezes, crackles, or rhonchi.  Gastrointestinal: Soft, non tender, and non distended with positive bowel sounds.  Genitourinary: No CVA tenderness. Musculoskeletal: Nontender with normal range of motion in all extremities. No cyanosis, or erythema of extremities. Neurologic:  Face is symmetric. Moving all extremities. No gross focal neurologic deficits . Skin: Skin is warm, dry.  No rash or ulcers Psychiatric: Unable to assess due to somnolence   Labs on Admission: I have personally reviewed following labs  and imaging studies  CBC: Recent Labs  Lab 06/07/20 2101 06/07/20 2335  WBC 18.2* 21.0*  NEUTROABS  --  17.6*  HGB 13.2 13.9  HCT 39.6 42.8  MCV 92.3 93.7  PLT 276 251   Basic Metabolic Panel: Recent Labs  Lab 06/07/20 2101  NA 133*  K 4.2  CL 105  CO2 20*  GLUCOSE 125*  BUN 15  CREATININE 1.51*  CALCIUM 8.5*   GFR: Estimated Creatinine Clearance: 51.4 mL/min (A) (by C-G formula based on SCr of 1.51 mg/dL (H)). Liver Function Tests: Recent Labs  Lab 06/07/20 2101  AST 21  ALT 15  ALKPHOS 52  BILITOT 1.3*  PROT 7.8  ALBUMIN 3.6   No results for input(s): LIPASE, AMYLASE in the last 168 hours. Recent Labs  Lab 06/07/20 2101  AMMONIA 32   Coagulation Profile: No results for input(s): INR, PROTIME in the last 168 hours. Cardiac Enzymes: No results for input(s): CKTOTAL, CKMB, CKMBINDEX, TROPONINI in the last 168 hours. BNP (last 3 results) No results for input(s): PROBNP in the last 8760 hours. HbA1C: No results for input(s): HGBA1C in the last 72 hours. CBG: Recent Labs  Lab 06/07/20 2338  GLUCAP 128*   Lipid Profile: No results for input(s): CHOL, HDL, LDLCALC, TRIG, CHOLHDL, LDLDIRECT in the last 72 hours. Thyroid Function Tests: No results for input(s): TSH, T4TOTAL, FREET4, T3FREE, THYROIDAB in the last 72 hours. Anemia Panel: No results for input(s): VITAMINB12, FOLATE, FERRITIN, TIBC, IRON, RETICCTPCT in the last 72 hours. Urine analysis:    Component Value Date/Time   COLORURINE AMBER (A) 06/07/2020 2335   APPEARANCEUR CLOUDY (A) 06/07/2020 2335   LABSPEC 1.030 06/07/2020 2335   PHURINE 5.0 06/07/2020 2335   GLUCOSEU NEGATIVE 06/07/2020 2335   HGBUR MODERATE (A) 06/07/2020 2335   BILIRUBINUR NEGATIVE 06/07/2020 2335   KETONESUR 5 (A) 06/07/2020 2335   PROTEINUR 100 (A) 06/07/2020 2335   NITRITE NEGATIVE 06/07/2020 2335   LEUKOCYTESUR NEGATIVE 06/07/2020 2335    Radiological Exams on Admission: DG Chest Portable 1 View  Result  Date: 06/08/2020 CLINICAL DATA:  Fever, cough EXAM: PORTABLE CHEST 1 VIEW COMPARISON:  12/22/2019 FINDINGS: Heart and mediastinal contours are within normal limits. No focal opacities or effusions. No acute bony abnormality. IMPRESSION: No active disease. Electronically Signed   By: Charlett Nose M.D.   On: 06/08/2020 00:18     Assessment/Plan 55 year old male with no significant past medical history presenting with generalized malaise and fever and altered mental status    Sepsis, source unknown (HCC) -Patient presented with altered mental status and malaise without other localizing signs of infection, fever of 103, tachycardia of 125, altered mental status and AKI with creatinine of 1.5 -On physical exam, no meningismus -Continue sepsis fluids -Continue broad-spectrum antibiotics -Follow procalcitonin -Follow blood cultures -We will get IR  consult for LP in the a.m. given no other identifiable source -Consider ID, neuro consult in the a.m.  Acute toxic-metabolic encephalopathy -Patient very somnolent -Possibly related to sepsis in combination with cocaine and cannabis seen on UDS -Continue to treat sepsis    Cocaine use -For counseling on abstinence when improved     AKI (acute kidney injury) (HCC) -Creatinine 1.5 with presumed normal baseline -Monitor response with IV hydration    DVT prophylaxis: Lovenox  Code Status: full code  Family Communication:  none  Disposition Plan: Back to previous home environment Consults called: IR Status: Observation    Andris Baumann MD [facility]     06/08/2020, 3:51 AM

## 2020-06-08 NOTE — Progress Notes (Signed)
Received pt from ED, transported via stretcher, accompanied by RN. Pt is aox2. Pt was able to to move from stretcher to bed and denies pain. Pt is incontinent of urine and bowel. Incontinent care provided. IVF infusions started. Call bell placed within reach. Orientation given but pt refused to listen.     At 0440, Dr Para March at the bedside to evaluate pt. Pt refused to be bothered.

## 2020-06-08 NOTE — Plan of Care (Signed)

## 2020-06-08 NOTE — Progress Notes (Signed)
Pharmacy Antibiotic Note  Alan Hampton is a 55 y.o. male admitted on 06/07/2020 with sepsis from unknown source/?poss meninigitis.  Pharmacy has been consulted for Cefepime and Vancomycin dosing. Pt also ordered Flagyl 500 mg IV q8h  Plan:  Cefepime 2 gm q8h per indication and renal fxn.  Vancomycin: Pt ordered total LD of Vanc 1500 mg per pt wt 63.5 kg Goal AUC <700 Expected AUC:608 Vanc trough 19 (goal 15-20 for meningitis) SCr used: 1.3, TBW 63.5 < IBW 73.0 Dose adj from 1250 q24h to 750 q12h for menigitis coverage  Pharmacy will continue to follow SCr closely and adjust abx dosing if warranted.      Height: 5\' 10"  (177.8 cm) Weight: 63.5 kg (140 lb) IBW/kg (Calculated) : 73  Temp (24hrs), Avg:99.8 F (37.7 C), Min:98.1 F (36.7 C), Max:103.6 F (39.8 C)  Recent Labs  Lab 06/07/20 2101 06/07/20 2335 06/08/20 0513  WBC 18.2* 21.0* 21.9*  CREATININE 1.51*  --  1.30*  LATICACIDVEN  --  1.9  --     Estimated Creatinine Clearance: 59.7 mL/min (A) (by C-G formula based on SCr of 1.3 mg/dL (H)).    No Known Allergies  Antimicrobials this admission: 04/05 Cefepime >>  04/05 Vancomycin >>  04/05 Flagyl >>  Microbiology results: 04/04 BCx: Pending 04/04 UCx: Pending   Thank you for allowing pharmacy to be a part of this patient's care.  06/04 PharmD Clinical Pharmacist 06/08/2020

## 2020-06-08 NOTE — Procedures (Signed)
Lumbar Puncture Procedure Note  Alan Hampton [facility]  [id number]  [birth year]  Date:06/08/20  Time:11:28 AM   Provider Performing:Annamary Buschman Allena Katz   Procedure: Fluoroscopic Guided Lumbar Puncture  Indication(s) Rule out meningitis  Consent  Risks of the procedure as well as the alternatives and risks of each were explained to the patient and/or caregiver.  Consent for the procedure was obtained and is signed in the bedside chart  Anesthesia Topical only with 1% lidocaine    Time Out Verified patient identification, verified procedure, site/side was marked, verified correct patient position, special equipment/implants available, medications/allergies/relevant history reviewed, required imaging and test results available.   Sterile Technique Maximal sterile technique including sterile barrier drape, hand hygiene, sterile gown, sterile gloves, mask, hair covering.    Procedure Description Using fluoroscopy, appropriate location for access identified.   Lidocaine used to anesthetize skin and subcutaneous tissue overlying this area.  A 22gspinal needle was then used to access the subarachnoid space. Opening pressure:Not obtained. Closing pressure:Not obtained. 5.5 mL CSF obtained.  Complications/Tolerance None; patient tolerated the procedure well.   EBL Minimal   Specimen(s) CSF

## 2020-06-08 NOTE — Progress Notes (Addendum)
PROGRESS NOTE    [facility]  UJW:[id number] DOB: Jun 27, 1965 DOA: 06/07/2020 PCP: Patient, No Pcp Per (Inactive)  Outpatient Specialists: none    Brief Narrative:   Alan Hampton is a 55 y.o. male with no significant past medical history who presents with a 1 day history of fatigue and generalized malaise, subjective fever, cough and an episode of vomiting.  Unable to obtain history from patient due to somnolence, and thus taken from ER provider and documented records thus far  ED Course: On arrival to the emergency room patient was initially very somnolent difficult to arouse and history had to be taken from significant other.  On arrival temperature 103.6, pulse 127, BP 110/65 respirations 20 with O2 sat 94% on room air.  Blood work significant for WBC 21,000, with a lactic acid 1.9 creatinine 1.5.  EtOH, ammonia and troponin all within normal limits.  Urinalysis without pyuria.  Urine drug screen positive for cocaine and cannabinoids.  COVID and flu swabs negative. EKG as reviewed by me : Sinus tachycardia at 125 with nonspecific ST-T wave changes Imaging: Chest x-ray no active disease  Patient was treated w with ammonia salts in the ED initially due to being difficult to arouse.  He subsequently came around.  After work-up was completed he was started on sepsis fluids and antibiotics for sepsis of unknown source.  Hospitalist consulted for admission for sepsis versus SlRS.    Assessment & Plan:   Active Problems:   Sepsis (HCC)   Acute metabolic encephalopathy   Cocaine use   AKI (acute kidney injury) (HCC)    # SIRS (HCC) # Acute toxic metabolic encephalopathy Presenting with 2 days headache, neck pain, cough and congestion, and a couple of episodes vomiting and diarrhea. Encephalopathic on presentation, now resolved. No meningismus. CXR clear. Covid/flu neg. WBC markedly elevated, febrile, tachycardic, elevated procalcitonin. Urine and blood cultures pending. Started on broad  spectrum abx in ED. Suspect viral process. History recent cocaine use, denies history IVDU - it is possible recent cocaine use could be the cause or contributor to AMS. Denies recent tick or other bug bites - LP this morning not consistent w/ bacterial meningitis, may be viral meningitis. Will check hsv and enterovirus pcr and f/u culture - continue broad spectrum abx. Vanc/flagyl/ceftriaxone - f/u hiv, hep c, quantiferon, rpr, and respiratory virus panel - holding on abdominal imaging given no vomiting or diarrhea since arrival and benign abdominal exam - may be stable for discharge tomorrow  # Acute kidney injury Cr 1.5 on presentation likely prerenal from SIRS above. Improving with fluids - continue IVF - trend  # Cocaine use - noted    DVT prophylaxis: lovenox Code Status: full Family Communication: none @ bedside  Level of care: Med-Surg Status is: Observation  The patient remains OBS appropriate and will d/c before 2 midnights.  Dispo: The patient is from: Home              Anticipated d/c is to: Home              Patient currently is not medically stable to d/c.   Difficult to place patient No        Consultants:  none  Procedures: none  Antimicrobials:  Vanc/cefepime/flagyl 4/5 Vanc/ceftriaxone/flagyl 4/5>   Subjective: This morning AAOx3. Feeling tired but otherwise well. No abdominal pain. No cough. No neck stiffness.  Objective: Vitals:   06/08/20 0138 06/08/20 0230 06/08/20 0246 06/08/20 0322  BP:  115/76 124/88 124/85  Pulse:  (!) 108 99 96  Resp:  20 20 18   Temp: (!) 100.6 F (38.1 C)  99.2 F (37.3 C) 98.9 F (37.2 C)  TempSrc: Rectal  Oral   SpO2:  97% 100% 99%  Weight:      Height:        Intake/Output Summary (Last 24 hours) at 06/08/2020 0740 Last data filed at 06/08/2020 0450 Gross per 24 hour  Intake 2440.25 ml  Output 315 ml  Net 2125.25 ml   Filed Weights   06/07/20 2056  Weight: 63.5 kg    Examination:  General  exam: Appears calm and comfortable. No neck stiffness. Respiratory system: Clear to auscultation. Respiratory effort normal. Cardiovascular system: S1 & S2 heard, RRR. No JVD, murmurs, rubs, gallops or clicks. No pedal edema. Gastrointestinal system: Abdomen is nondistended, soft and nontender. No organomegaly or masses felt. Normal bowel sounds heard. Central nervous system: Alert and oriented. No focal neurological deficits. Extremities: Symmetric 5 x 5 power. Skin: No rashes, lesions or ulcers Psychiatry: Judgement and insight appear normal. Mood & affect appropriate.     Data Reviewed: I have personally reviewed following labs and imaging studies  CBC: Recent Labs  Lab 06/07/20 2101 06/07/20 2335 06/08/20 0513  WBC 18.2* 21.0* 21.9*  NEUTROABS  --  17.6*  --   HGB 13.2 13.9 11.9*  HCT 39.6 42.8 36.2*  MCV 92.3 93.7 93.3  PLT 276 251 251   Basic Metabolic Panel: Recent Labs  Lab 06/07/20 2101 06/08/20 0513  NA 133* 137  K 4.2 4.2  CL 105 108  CO2 20* 22  GLUCOSE 125* 117*  BUN 15 16  CREATININE 1.51* 1.30*  CALCIUM 8.5* 8.5*   GFR: Estimated Creatinine Clearance: 59.7 mL/min (A) (by C-G formula based on SCr of 1.3 mg/dL (H)). Liver Function Tests: Recent Labs  Lab 06/07/20 2101  AST 21  ALT 15  ALKPHOS 52  BILITOT 1.3*  PROT 7.8  ALBUMIN 3.6   No results for input(s): LIPASE, AMYLASE in the last 168 hours. Recent Labs  Lab 06/07/20 2101  AMMONIA 32   Coagulation Profile: Recent Labs  Lab 06/08/20 0513  INR 1.2   Cardiac Enzymes: No results for input(s): CKTOTAL, CKMB, CKMBINDEX, TROPONINI in the last 168 hours. BNP (last 3 results) No results for input(s): PROBNP in the last 8760 hours. HbA1C: No results for input(s): HGBA1C in the last 72 hours. CBG: Recent Labs  Lab 06/07/20 2338  GLUCAP 128*   Lipid Profile: No results for input(s): CHOL, HDL, LDLCALC, TRIG, CHOLHDL, LDLDIRECT in the last 72 hours. Thyroid Function Tests: No  results for input(s): TSH, T4TOTAL, FREET4, T3FREE, THYROIDAB in the last 72 hours. Anemia Panel: No results for input(s): VITAMINB12, FOLATE, FERRITIN, TIBC, IRON, RETICCTPCT in the last 72 hours. Urine analysis:    Component Value Date/Time   COLORURINE AMBER (A) 06/07/2020 2335   APPEARANCEUR CLOUDY (A) 06/07/2020 2335   LABSPEC 1.030 06/07/2020 2335   PHURINE 5.0 06/07/2020 2335   GLUCOSEU NEGATIVE 06/07/2020 2335   HGBUR MODERATE (A) 06/07/2020 2335   BILIRUBINUR NEGATIVE 06/07/2020 2335   KETONESUR 5 (A) 06/07/2020 2335   PROTEINUR 100 (A) 06/07/2020 2335   NITRITE NEGATIVE 06/07/2020 2335   LEUKOCYTESUR NEGATIVE 06/07/2020 2335   Sepsis Labs: @LABRCNTIP (procalcitonin:4,lacticidven:4)  ) Recent Results (from the past 240 hour(s))  Culture, blood (Routine X 2) w Reflex to ID Panel     Status: None (Preliminary result)   Collection Time: 06/07/20 11:35 PM  Specimen: BLOOD  Result Value Ref Range Status   Specimen Description BLOOD FOREARM  Final   Special Requests   Final    BOTTLES DRAWN AEROBIC AND ANAEROBIC Blood Culture adequate volume   Culture   Final    NO GROWTH < 12 HOURS Performed at [facility], [address], [place], [place] [address]    Report Status PENDING  Incomplete  Resp Panel by RT-PCR (Flu A&B, Covid) Nasopharyngeal Swab     Status: None   Collection Time: 06/07/20 11:35 PM   Specimen: Nasopharyngeal Swab; Nasopharyngeal(NP) swabs in vial transport medium  Result Value Ref Range Status   SARS Coronavirus 2 by RT PCR NEGATIVE NEGATIVE Final    Comment: (NOTE) SARS-CoV-2 target nucleic acids are NOT DETECTED.  The SARS-CoV-2 RNA is generally detectable in upper respiratory specimens during the acute phase of infection. The lowest concentration of SARS-CoV-2 viral copies this assay can detect is 138 copies/mL. A negative result does not preclude SARS-Cov-2 infection and should not be used as the sole basis for treatment or other  patient management decisions. A negative result may occur with  improper specimen collection/handling, submission of specimen other than nasopharyngeal swab, presence of viral mutation(s) within the areas targeted by this assay, and inadequate number of viral copies(<138 copies/mL). A negative result must be combined with clinical observations, patient history, and epidemiological information. The expected result is Negative.  Fact Sheet for Patients:  BloggerCourse.com  Fact Sheet for Healthcare Providers:  SeriousBroker.it  This test is no t yet approved or cleared by the [place] FDA and  has been authorized for detection and/or diagnosis of SARS-CoV-2 by FDA under an Emergency Use Authorization (EUA). This EUA will remain  in effect (meaning this test can be used) for the duration of the COVID-19 declaration under Section 564(b)(1) of the Act, 21 U.S.C.section 360bbb-3(b)(1), unless the authorization is terminated  or revoked sooner.       Influenza A by PCR NEGATIVE NEGATIVE Final   Influenza B by PCR NEGATIVE NEGATIVE Final    Comment: (NOTE) The Xpert Xpress SARS-CoV-2/FLU/RSV plus assay is intended as an aid in the diagnosis of influenza from Nasopharyngeal swab specimens and should not be used as a sole basis for treatment. Nasal washings and aspirates are unacceptable for Xpert Xpress SARS-CoV-2/FLU/RSV testing.  Fact Sheet for Patients: BloggerCourse.com  Fact Sheet for Healthcare Providers: SeriousBroker.it  This test is not yet approved or cleared by the [place] FDA and has been authorized for detection and/or diagnosis of SARS-CoV-2 by FDA under an Emergency Use Authorization (EUA). This EUA will remain in effect (meaning this test can be used) for the duration of the COVID-19 declaration under Section 564(b)(1) of the Act, 21 U.S.C. section  360bbb-3(b)(1), unless the authorization is terminated or revoked.  Performed at [facility], [address] [place], [place] [address]   Culture, blood (Routine X 2) w Reflex to ID Panel     Status: None (Preliminary result)   Collection Time: 06/07/20 11:39 PM   Specimen: BLOOD  Result Value Ref Range Status   Specimen Description BLOOD LEFT  Final   Special Requests   Final    BOTTLES DRAWN AEROBIC AND ANAEROBIC Blood Culture results may not be optimal due to an excessive volume of blood received in culture bottles   Culture   Final    NO GROWTH < 12 HOURS Performed at [facility], [address], [place], [place] [address]  Report Status PENDING  Incomplete         Radiology Studies: DG Chest Portable 1 View  Result Date: 06/08/2020 CLINICAL DATA:  Fever, cough EXAM: PORTABLE CHEST 1 VIEW COMPARISON:  12/22/2019 FINDINGS: Heart and mediastinal contours are within normal limits. No focal opacities or effusions. No acute bony abnormality. IMPRESSION: No active disease. Electronically Signed   By: Charlett Nose M.D.   On: 06/08/2020 00:18        Scheduled Meds: Continuous Infusions: . sodium chloride 150 mL/hr at 06/08/20 0327  . ceFEPime (MAXIPIME) IV    . metronidazole Stopped (06/08/20 0434)  . [START ON 06/09/2020] vancomycin       LOS: 0 days    Time spent: 50 min    Silvano Bilis, MD [facility]   If 7PM-7AM, please contact night-coverage [email or web address] Password [facility] 06/08/2020, 7:40 AM

## 2020-06-08 NOTE — ED Notes (Signed)
Pt awakened with medication and alert. MD aware.

## 2020-06-09 DIAGNOSIS — N179 Acute kidney failure, unspecified: Secondary | ICD-10-CM

## 2020-06-09 DIAGNOSIS — G9341 Metabolic encephalopathy: Secondary | ICD-10-CM

## 2020-06-09 LAB — URINE CULTURE: Culture: NO GROWTH

## 2020-06-09 LAB — CBC
HCT: 34.2 % — ABNORMAL LOW (ref 39.0–52.0)
Hemoglobin: 11.2 g/dL — ABNORMAL LOW (ref 13.0–17.0)
MCH: 30.7 pg (ref 26.0–34.0)
MCHC: 32.7 g/dL (ref 30.0–36.0)
MCV: 93.7 fL (ref 80.0–100.0)
Platelets: 226 10*3/uL (ref 150–400)
RBC: 3.65 MIL/uL — ABNORMAL LOW (ref 4.22–5.81)
RDW: 12.3 % (ref 11.5–15.5)
WBC: 11 10*3/uL — ABNORMAL HIGH (ref 4.0–10.5)
nRBC: 0 % (ref 0.0–0.2)

## 2020-06-09 LAB — BASIC METABOLIC PANEL
Anion gap: 8 (ref 5–15)
BUN: 14 mg/dL (ref 6–20)
CO2: 21 mmol/L — ABNORMAL LOW (ref 22–32)
Calcium: 8 mg/dL — ABNORMAL LOW (ref 8.9–10.3)
Chloride: 109 mmol/L (ref 98–111)
Creatinine, Ser: 1.05 mg/dL (ref 0.61–1.24)
GFR, Estimated: >60 mL/min (ref >60)
Glucose, Bld: 92 mg/dL (ref 70–99)
Potassium: 4.1 mmol/L (ref 3.5–5.1)
Sodium: 138 mmol/L (ref 135–145)

## 2020-06-09 LAB — RPR: RPR Ser Ql: NONREACTIVE

## 2020-06-09 MED ORDER — CEFDINIR 300 MG PO CAPS: 300.0000 mg | ORAL_CAPSULE | Freq: Two times a day (BID) | ORAL | 0 refills | Status: AC

## 2020-06-09 MED ORDER — SODIUM CHLORIDE 0.9 % IV SOLN
2.0000 g | INTRAVENOUS | Status: DC
  Administered 2020-06-09: 2 g via INTRAVENOUS
  Filled 2020-06-09: qty 2
  Filled 2020-06-09: qty 20

## 2020-06-09 NOTE — Plan of Care (Signed)
  Problem: Education: Goal: Knowledge of General Education information will improve Description: Including pain rating scale, medication(s)/side effects and non-pharmacologic comfort measures 06/09/2020 0743 by Massie Bougie, RN Outcome: Progressing 06/08/2020 1947 by Massie Bougie, RN Outcome: Progressing   Problem: Health Behavior/Discharge Planning: Goal: Ability to manage health-related needs will improve 06/09/2020 0743 by Massie Bougie, RN Outcome: Progressing 06/08/2020 1947 by Massie Bougie, RN Outcome: Progressing   Problem: Clinical Measurements: Goal: Ability to maintain clinical measurements within normal limits will improve 06/09/2020 0743 by Massie Bougie, RN Outcome: Progressing 06/08/2020 1947 by Massie Bougie, RN Outcome: Progressing

## 2020-06-09 NOTE — Discharge Summary (Signed)
Physician Discharge Summary  Alan Hampton [facility] ION:[id number] DOB: Dec 01, 1965 DOA: 06/07/2020  PCP: Patient, No Pcp Per (Inactive)  Admit date: 06/07/2020 Discharge date: 06/09/2020  Admitted From: home Disposition:  home  Recommendations for Outpatient Follow-up:  1. Follow up with PCP in 1-2 weeks 2. Please obtain BMP/CBC in one week  Home Health: No  Equipment/Devices: None   Discharge Condition: Stable  CODE STATUS: Full  Diet recommendation: Regular    Discharge Diagnoses: Active Problems:   Sepsis (HCC)   Acute metabolic encephalopathy   Cocaine use   AKI (acute kidney injury) (HCC)    Summary of HPI and Hospital Course:   Alan Hampton [facility] a 55 y.o.malewithno significant past medical history who presents with a 1 day history of fatigue and generalized malaise, subjective fever, cough and an episode of vomiting.Unable to obtain history from patientdue to somnolence, and thustaken from ER provider and documented records thus far ED Course:On arrival to the emergency room patient was initially very somnolent difficult to arouse and history had to be taken from significant other. On arrival temperature 103.6, pulse 127, BP 110/65 respirations 20 with O2 sat 94% on room air. Blood work significant for WBC 21,000, with a lactic acid 1.9 creatinine 1.5. EtOH, ammonia and troponin all within normal limits. Urinalysis without pyuria. Urine drug screen positive for cocaine and cannabinoids. COVID and flu swabs negative. EKG as reviewed by me :Sinus tachycardia at 125 with nonspecific ST-T wave changes Imaging:Chest x-ray no active disease  Patient was treated w with ammonia salts in the ED initially due to being difficult to arouse. He subsequently came around. After work-up was completed he was started on sepsis fluids and antibiotics for sepsis of unknown source. Hospitalist consulted for admission for sepsis versus SlRS.  He was treated with broad spectrum antibiotics  and clinically improved.  Tolerating diet and PO intake, afebrile over 24 hours.  Mentation at baseline and normal.   Stable for discharge home with PCP follow up.    SIRS- POA with leukocytosis, tachycardia, fever.  Procalcitonin elevated.  No source of infection was identified. Clinically improved with empiric broad spectrum antibiotics, Vanc, Rocephin, Flagyl.  --follow blood and urine cx - neg to date --discharge with 3 more days Omnicef empirically, given improvement with coverage here   Acute toxic metabolic encephalopathy - POA, resolved. Presented with 2 days headache, neck pain, cough and congestion, and a couple of episodes vomiting and diarrhea. No meningismus. CXR clear. Covid/flu neg.  Lumbar puncture done, CSF studies negative for bacterial meningitis.  Viral meningitis not excluded but clinically seems unlikely.  Possible recent cocaine use could contribute to AMS. No recent tick or other bug bites.  Respiratory viral panel negative. --follow CSF cx, and PCR's for HSV, enterovirus, Quantiferon   Acute kidney injury - Cr 1.5 on presentation likely prerenal from above illness.  Resolved with fluids.  Substance abuse - UDS positive for cocaine, cannabis. Counseled regarding importance of cessation.   Discharge Instructions    Allergies as of 06/09/2020   No Known Allergies     Medication List    TAKE these medications   cefdinir 300 MG capsule Commonly known as: OMNICEF Take 1 capsule (300 mg total) by mouth 2 (two) times daily for 3 days.       No Known Allergies   If you experience worsening of your admission symptoms, develop shortness of breath, life threatening emergency, suicidal or homicidal thoughts you must seek medical attention immediately by calling 911 or  calling your MD immediately  if symptoms less severe.    Please note   You were cared for by a hospitalist during your hospital stay. If you have any questions about your discharge  medications or the care you received while you were in the hospital after you are discharged, you can call the unit and asked to speak with the hospitalist on call if the hospitalist that took care of you is not available. Once you are discharged, your primary care physician will handle any further medical issues. Please note that NO REFILLS for any discharge medications will be authorized once you are discharged, as it is imperative that you return to your primary care physician (or establish a relationship with a primary care physician if you do not have one) for your aftercare needs so that they can reassess your need for medications and monitor your lab values.   Consultations:   none   Procedures/Studies: DG Chest Portable 1 View  Result Date: 06/08/2020 CLINICAL DATA:  Fever, cough EXAM: PORTABLE CHEST 1 VIEW COMPARISON:  12/22/2019 FINDINGS: Heart and mediastinal contours are within normal limits. No focal opacities or effusions. No acute bony abnormality. IMPRESSION: No active disease. Electronically Signed   By: Alan Nose M.D.   On: 06/08/2020 00:18   DG FL GUIDED LUMBAR PUNCTURE  Result Date: 06/08/2020 Alan Spanish, MD     06/08/2020 11:29 AM Lumbar Puncture Procedure Note Alan Hampton [facility] [id number] 1965-09-27 Date:06/08/20 Time:11:28 AM Provider Performing:Alan Hampton Procedure: Fluoroscopic Guided Lumbar Puncture Indication(s) Rule out meningitis Consent Risks of the procedure as well as the alternatives and risks of each were explained to the patient and/or caregiver.  Consent for the procedure was obtained and is signed in the bedside chart Anesthesia Topical only with 1% lidocaine Time Out Verified patient identification, verified procedure, site/side was marked, verified correct patient position, special equipment/implants available, medications/allergies/relevant history reviewed, required imaging and test results available. Sterile Technique Maximal sterile technique including  sterile barrier drape, hand hygiene, sterile gown, sterile gloves, mask, hair covering. Procedure Description Using fluoroscopy, appropriate location for access identified.   Lidocaine used to anesthetize skin and subcutaneous tissue overlying this area.  A 22gspinal needle was then used to access the subarachnoid space. Opening pressure:Not obtained. Closing pressure:Not obtained. 5.5 mL CSF obtained. Complications/Tolerance None; patient tolerated the procedure well. EBL Minimal Specimen(s) CSF       Subjective: Pt feels better today.  Ate breakfast and tolerated with no abdominal pain, N/V, no diarrhea.  Denies fever/chills or other acute complaints.  States ready to go home.    Discharge Exam: Vitals:   06/09/20 0506 06/09/20 0820  BP: 114/80 103/77  Pulse: 92 90  Resp: 17 16  Temp: 98.4 F (36.9 C) 98.3 F (36.8 C)  SpO2: 100% 100%   Vitals:   06/08/20 2018 06/08/20 2351 06/09/20 0506 06/09/20 0820  BP: 102/68 116/76 114/80 103/77  Pulse: 98 92 92 90  Resp: 20 20 17 16   Temp: 98.5 F (36.9 C) 98 F (36.7 C) 98.4 F (36.9 C) 98.3 F (36.8 C)  TempSrc:    Oral  SpO2: 99% 100% 100% 100%  Weight:      Height:        General: Pt is alert, awake, not in acute distress Cardiovascular: RRR, S1/S2 +, no rubs, no gallops Respiratory: CTA bilaterally, no wheezing, no rhonchi Abdominal: Soft, NT, ND, bowel sounds + Extremities: no edema, no cyanosis    The results of significant diagnostics from  this hospitalization (including imaging, microbiology, ancillary and laboratory) are listed below for reference.     Microbiology: Recent Results (from the past 240 hour(s))  Urine Culture     Status: None   Collection Time: 06/07/20 11:35 PM   Specimen: Urine, Random  Result Value Ref Range Status   Specimen Description   Final    URINE, RANDOM Performed at [facility], [address], [place], [place] [address]    Special Requests   Final    NONE Performed at  [facility], [address] [place], [place] [address]    Culture   Final    NO GROWTH Performed at [facility], [address] [place], [place] [address]    Report Status 06/09/2020 FINAL  Final  Culture, blood (Routine X 2) w Reflex to ID Panel     Status: None (Preliminary result)   Collection Time: 06/07/20 11:35 PM   Specimen: BLOOD  Result Value Ref Range Status   Specimen Description BLOOD FOREARM  Final   Special Requests   Final    BOTTLES DRAWN AEROBIC AND ANAEROBIC Blood Culture adequate volume   Culture   Final    NO GROWTH 1 DAY Performed at [facility], [address], [place], [place] [address]    Report Status PENDING  Incomplete  Resp Panel by RT-PCR (Flu A&B, Covid) Nasopharyngeal Swab     Status: None   Collection Time: 06/07/20 11:35 PM   Specimen: Nasopharyngeal Swab; Nasopharyngeal(NP) swabs in vial transport medium  Result Value Ref Range Status   SARS Coronavirus 2 by RT PCR NEGATIVE NEGATIVE Final    Comment: (NOTE) SARS-CoV-2 target nucleic acids are NOT DETECTED.  The SARS-CoV-2 RNA is generally detectable in upper respiratory specimens during the acute phase of infection. The lowest concentration of SARS-CoV-2 viral copies this assay can detect is 138 copies/mL. A negative result does not preclude SARS-Cov-2 infection and should not be used as the sole basis for treatment or other patient management decisions. A negative result may occur with  improper specimen collection/handling, submission of specimen other than nasopharyngeal swab, presence of viral mutation(s) within the areas targeted by this assay, and inadequate number of viral copies(<138 copies/mL). A negative result must be combined with clinical observations, patient history, and epidemiological information. The expected result is Negative.  Fact Sheet for Patients:  BloggerCourse.com  Fact Sheet for Healthcare Providers:   SeriousBroker.it  This test is no t yet approved or cleared by the [place] FDA and  has been authorized for detection and/or diagnosis of SARS-CoV-2 by FDA under an Emergency Use Authorization (EUA). This EUA will remain  in effect (meaning this test can be used) for the duration of the COVID-19 declaration under Section 564(b)(1) of the Act, 21 U.S.C.section 360bbb-3(b)(1), unless the authorization is terminated  or revoked sooner.       Influenza A by PCR NEGATIVE NEGATIVE Final   Influenza B by PCR NEGATIVE NEGATIVE Final    Comment: (NOTE) The Xpert Xpress SARS-CoV-2/FLU/RSV plus assay is intended as an aid in the diagnosis of influenza from Nasopharyngeal swab specimens and should not be used as a sole basis for treatment. Nasal washings and aspirates are unacceptable for Xpert Xpress SARS-CoV-2/FLU/RSV testing.  Fact Sheet for Patients: BloggerCourse.com  Fact Sheet for Healthcare Providers: SeriousBroker.it  This test is not yet approved or cleared by the [place] FDA and has been authorized for detection and/or diagnosis of SARS-CoV-2 by FDA under  an Emergency Use Authorization (EUA). This EUA will remain in effect (meaning this test can be used) for the duration of the COVID-19 declaration under Section 564(b)(1) of the Act, 21 U.S.C. section 360bbb-3(b)(1), unless the authorization is terminated or revoked.  Performed at [facility], [address] [place], [place] [address]   Culture, blood (Routine X 2) w Reflex to ID Panel     Status: None (Preliminary result)   Collection Time: 06/07/20 11:39 PM   Specimen: BLOOD  Result Value Ref Range Status   Specimen Description BLOOD LEFT  Final   Special Requests   Final    BOTTLES DRAWN AEROBIC AND ANAEROBIC Blood Culture results may not be optimal due to an excessive volume of blood received in culture bottles   Culture    Final    NO GROWTH 1 DAY Performed at [facility], [address] [place], [place] [address]    Report Status PENDING  Incomplete  CSF culture w Gram Stain     Status: None (Preliminary result)   Collection Time: 06/08/20 11:05 AM   Specimen: PATH Cytology CSF; Cerebrospinal Fluid  Result Value Ref Range Status   Specimen Description   Final    CSF Performed at [facility], [address] [place], [place] [address]    Special Requests   Final    NONE Performed at [facility], [address] [place], [place] [address]    Gram Stain   Final    WBC PRESENT, PREDOMINANTLY MONONUCLEAR NO ORGANISMS SEEN    Culture   Final    NO GROWTH < 24 HOURS Performed at [facility], [address] [place], [place] [address]    Report Status PENDING  Incomplete  Respiratory (~20 pathogens) panel by PCR     Status: None   Collection Time: 06/08/20  4:20 PM   Specimen: Nasopharyngeal Swab; Respiratory  Result Value Ref Range Status   Adenovirus NOT DETECTED NOT DETECTED Final   Coronavirus 229E NOT DETECTED NOT DETECTED Final    Comment: (NOTE) The Coronavirus on the Respiratory Panel, DOES NOT test for the novel  Coronavirus (2019 nCoV)    Coronavirus HKU1 NOT DETECTED NOT DETECTED Final   Coronavirus NL63 NOT DETECTED NOT DETECTED Final   Coronavirus OC43 NOT DETECTED NOT DETECTED Final   Metapneumovirus NOT DETECTED NOT DETECTED Final   Rhinovirus / Enterovirus NOT DETECTED NOT DETECTED Final   Influenza A NOT DETECTED NOT DETECTED Final   Influenza B NOT DETECTED NOT DETECTED Final   Parainfluenza Virus 1 NOT DETECTED NOT DETECTED Final   Parainfluenza Virus 2 NOT DETECTED NOT DETECTED Final   Parainfluenza Virus 3 NOT DETECTED NOT DETECTED Final   Parainfluenza Virus 4 NOT DETECTED NOT DETECTED Final   Respiratory Syncytial Virus NOT DETECTED NOT DETECTED Final   Bordetella pertussis NOT DETECTED NOT DETECTED Final   Bordetella  Parapertussis NOT DETECTED NOT DETECTED Final   Chlamydophila pneumoniae NOT DETECTED NOT DETECTED Final   Mycoplasma pneumoniae NOT DETECTED NOT DETECTED Final    Comment: Performed at [facility], [address] [place], [place] [address]     Labs: BNP (last 3 results) No results for input(s): BNP in the last 8760 hours. Basic Metabolic Panel: Recent Labs  Lab 06/07/20 2101 06/08/20 0513 06/09/20 0548  NA 133* 137 138  K 4.2 4.2 4.1  CL 105 108 109  CO2 20* 22 21*  GLUCOSE 125* 117* 92  BUN  15 16 14   CREATININE 1.51* 1.30* 1.05  CALCIUM 8.5* 8.5* 8.0*   Liver Function Tests: Recent Labs  Lab 06/07/20 2101  AST 21  ALT 15  ALKPHOS 52  BILITOT 1.3*  PROT 7.8  ALBUMIN 3.6   No results for input(s): LIPASE, AMYLASE in the last 168 hours. Recent Labs  Lab 06/07/20 2101  AMMONIA 32   CBC: Recent Labs  Lab 06/07/20 2101 06/07/20 2335 06/08/20 0513 06/09/20 0548  WBC 18.2* 21.0* 21.9* 11.0*  NEUTROABS  --  17.6*  --   --   HGB 13.2 13.9 11.9* 11.2*  HCT 39.6 42.8 36.2* 34.2*  MCV 92.3 93.7 93.3 93.7  PLT 276 251 251 226   Cardiac Enzymes: No results for input(s): CKTOTAL, CKMB, CKMBINDEX, TROPONINI in the last 168 hours. BNP: Invalid input(s): POCBNP CBG: Recent Labs  Lab 06/07/20 2338  GLUCAP 128*   D-Dimer No results for input(s): DDIMER in the last 72 hours. Hgb A1c No results for input(s): HGBA1C in the last 72 hours. Lipid Profile No results for input(s): CHOL, HDL, LDLCALC, TRIG, CHOLHDL, LDLDIRECT in the last 72 hours. Thyroid function studies No results for input(s): TSH, T4TOTAL, T3FREE, THYROIDAB in the last 72 hours.  Invalid input(s): FREET3 Anemia work up No results for input(s): VITAMINB12, FOLATE, FERRITIN, TIBC, IRON, RETICCTPCT in the last 72 hours. Urinalysis    Component Value Date/Time   COLORURINE AMBER (A) 06/07/2020 2335   APPEARANCEUR CLOUDY (A) 06/07/2020 2335   LABSPEC 1.030 06/07/2020 2335   PHURINE 5.0  06/07/2020 2335   GLUCOSEU NEGATIVE 06/07/2020 2335   HGBUR MODERATE (A) 06/07/2020 2335   BILIRUBINUR NEGATIVE 06/07/2020 2335   KETONESUR 5 (A) 06/07/2020 2335   PROTEINUR 100 (A) 06/07/2020 2335   NITRITE NEGATIVE 06/07/2020 2335   LEUKOCYTESUR NEGATIVE 06/07/2020 2335   Sepsis Labs Invalid input(s): PROCALCITONIN,  WBC,  LACTICIDVEN Microbiology Recent Results (from the past 240 hour(s))  Urine Culture     Status: None   Collection Time: 06/07/20 11:35 PM   Specimen: Urine, Random  Result Value Ref Range Status   Specimen Description   Final    URINE, RANDOM Performed at [facility], [address], [place], [place]    Special Requests   Final    NONE Performed at [facility], [address] [place], [place]    Culture   Final    NO GROWTH Performed at [facility], [address], [place], [place]    Report Status 06/09/2020 FINAL  Final  Culture, blood (Routine X 2) w Reflex to ID Panel     Status: None (Preliminary result)   Collection Time: 06/07/20 11:35 PM   Specimen: BLOOD  Result Value Ref Range Status   Specimen Description BLOOD FOREARM  Final   Special Requests   Final    BOTTLES DRAWN AEROBIC AND ANAEROBIC Blood Culture adequate volume   Culture   Final    NO GROWTH 1 DAY Performed at [facility], [address] [place], [place]    Report Status PENDING  Incomplete  Resp Panel by RT-PCR (Flu A&B, Covid) Nasopharyngeal Swab     Status: None   Collection Time: 06/07/20 11:35 PM   Specimen: Nasopharyngeal Swab; Nasopharyngeal(NP) swabs in vial transport medium  Result Value Ref Range Status   SARS Coronavirus 2 by RT PCR NEGATIVE NEGATIVE Final    Comment: (NOTE) SARS-CoV-2 target nucleic acids are NOT DETECTED.  The  SARS-CoV-2 RNA is generally detectable in upper respiratory specimens during the acute phase of infection. The lowest concentration of SARS-CoV-2 viral copies  this assay can detect is 138 copies/mL. A negative result does not preclude SARS-Cov-2 infection and should not be used as the sole basis for treatment or other patient management decisions. A negative result may occur with  improper specimen collection/handling, submission of specimen other than nasopharyngeal swab, presence of viral mutation(s) within the areas targeted by this assay, and inadequate number of viral copies(<138 copies/mL). A negative result must be combined with clinical observations, patient history, and epidemiological information. The expected result is Negative.  Fact Sheet for Patients:  BloggerCourse.com  Fact Sheet for Healthcare Providers:  SeriousBroker.it  This test is no t yet approved or cleared by the [place] FDA and  has been authorized for detection and/or diagnosis of SARS-CoV-2 by FDA under an Emergency Use Authorization (EUA). This EUA will remain  in effect (meaning this test can be used) for the duration of the COVID-19 declaration under Section 564(b)(1) of the Act, 21 U.S.C.section 360bbb-3(b)(1), unless the authorization is terminated  or revoked sooner.       Influenza A by PCR NEGATIVE NEGATIVE Final   Influenza B by PCR NEGATIVE NEGATIVE Final    Comment: (NOTE) The Xpert Xpress SARS-CoV-2/FLU/RSV plus assay is intended as an aid in the diagnosis of influenza from Nasopharyngeal swab specimens and should not be used as a sole basis for treatment. Nasal washings and aspirates are unacceptable for Xpert Xpress SARS-CoV-2/FLU/RSV testing.  Fact Sheet for Patients: BloggerCourse.com  Fact Sheet for Healthcare Providers: SeriousBroker.it  This test is not yet approved or cleared by the [place] FDA and has been authorized for detection and/or diagnosis of SARS-CoV-2 by FDA under an Emergency Use Authorization (EUA). This EUA  will remain in effect (meaning this test can be used) for the duration of the COVID-19 declaration under Section 564(b)(1) of the Act, 21 U.S.C. section 360bbb-3(b)(1), unless the authorization is terminated or revoked.  Performed at [facility], [address] [place], [place] [address]   Culture, blood (Routine X 2) w Reflex to ID Panel     Status: None (Preliminary result)   Collection Time: 06/07/20 11:39 PM   Specimen: BLOOD  Result Value Ref Range Status   Specimen Description BLOOD LEFT  Final   Special Requests   Final    BOTTLES DRAWN AEROBIC AND ANAEROBIC Blood Culture results may not be optimal due to an excessive volume of blood received in culture bottles   Culture   Final    NO GROWTH 1 DAY Performed at [facility], [address] [place], [place] [address]    Report Status PENDING  Incomplete  CSF culture w Gram Stain     Status: None (Preliminary result)   Collection Time: 06/08/20 11:05 AM   Specimen: PATH Cytology CSF; Cerebrospinal Fluid  Result Value Ref Range Status   Specimen Description   Final    CSF Performed at [facility], [address] [place], [place] [address]    Special Requests   Final    NONE Performed at [facility], [address] [place], [place] [address]    Gram Stain   Final    WBC PRESENT, PREDOMINANTLY MONONUCLEAR NO ORGANISMS SEEN    Culture   Final    NO GROWTH < 24 HOURS Performed at [facility], [address] [place], [place] [address]  Report Status PENDING  Incomplete  Respiratory (~20 pathogens) panel by PCR     Status: None   Collection Time: 06/08/20  4:20 PM   Specimen: Nasopharyngeal Swab; Respiratory  Result Value Ref Range Status   Adenovirus NOT DETECTED NOT DETECTED Final   Coronavirus 229E NOT DETECTED NOT DETECTED Final    Comment: (NOTE) The Coronavirus on the Respiratory Panel, DOES NOT test for the novel  Coronavirus (2019 nCoV)    Coronavirus  HKU1 NOT DETECTED NOT DETECTED Final   Coronavirus NL63 NOT DETECTED NOT DETECTED Final   Coronavirus OC43 NOT DETECTED NOT DETECTED Final   Metapneumovirus NOT DETECTED NOT DETECTED Final   Rhinovirus / Enterovirus NOT DETECTED NOT DETECTED Final   Influenza A NOT DETECTED NOT DETECTED Final   Influenza B NOT DETECTED NOT DETECTED Final   Parainfluenza Virus 1 NOT DETECTED NOT DETECTED Final   Parainfluenza Virus 2 NOT DETECTED NOT DETECTED Final   Parainfluenza Virus 3 NOT DETECTED NOT DETECTED Final   Parainfluenza Virus 4 NOT DETECTED NOT DETECTED Final   Respiratory Syncytial Virus NOT DETECTED NOT DETECTED Final   Bordetella pertussis NOT DETECTED NOT DETECTED Final   Bordetella Parapertussis NOT DETECTED NOT DETECTED Final   Chlamydophila pneumoniae NOT DETECTED NOT DETECTED Final   Mycoplasma pneumoniae NOT DETECTED NOT DETECTED Final    Comment: Performed at [facility], [address], [place], [place] [address]     Time coordinating discharge: Over 30 minutes  SIGNED:   Pennie Banter, DO [facility] 06/09/2020, 10:53 AM   If 7PM-7AM, please contact night-coverage [email or web address]

## 2020-06-10 LAB — QUANTIFERON-TB GOLD PLUS (RQFGPL)
QuantiFERON Mitogen Value: 3.22 IU/mL
QuantiFERON Nil Value: 0.03 IU/mL
QuantiFERON TB1 Ag Value: 0.03 IU/mL
QuantiFERON TB2 Ag Value: 0.05 IU/mL

## 2020-06-10 LAB — QUANTIFERON-TB GOLD PLUS: QuantiFERON-TB Gold Plus: NEGATIVE

## 2020-06-10 LAB — ENTEROVIRUS PCR: Enterovirus PCR: NEGATIVE

## 2020-06-11 LAB — HSV 1/2 PCR, CSF
HSV-1 DNA: NEGATIVE
HSV-2 DNA: NEGATIVE

## 2020-06-11 LAB — CSF CULTURE W GRAM STAIN: Culture: NO GROWTH

## 2020-06-13 LAB — CULTURE, BLOOD (ROUTINE X 2)
Culture: NO GROWTH
Culture: NO GROWTH
Special Requests: ADEQUATE

## 2021-01-08 ENCOUNTER — Emergency Department
Admission: EM | Admit: 2021-01-08 | Discharge: 2021-01-08 | Disposition: A | Discharge status: Home / Self Care | Payer: Self-pay | Attending: Emergency Medicine | Admitting: Emergency Medicine

## 2021-01-08 ENCOUNTER — Emergency Department: Payer: Self-pay

## 2021-01-08 ENCOUNTER — Other Ambulatory Visit: Payer: Self-pay

## 2021-01-08 DIAGNOSIS — F17210 Nicotine dependence, cigarettes, uncomplicated: Secondary | ICD-10-CM | POA: Insufficient documentation

## 2021-01-08 DIAGNOSIS — A549 Gonococcal infection, unspecified: Secondary | ICD-10-CM

## 2021-01-08 DIAGNOSIS — N390 Urinary tract infection, site not specified: Secondary | ICD-10-CM | POA: Insufficient documentation

## 2021-01-08 DIAGNOSIS — N179 Acute kidney failure, unspecified: Secondary | ICD-10-CM | POA: Insufficient documentation

## 2021-01-08 DIAGNOSIS — Z20822 Contact with and (suspected) exposure to covid-19: Secondary | ICD-10-CM | POA: Insufficient documentation

## 2021-01-08 DIAGNOSIS — R531 Weakness: Secondary | ICD-10-CM | POA: Insufficient documentation

## 2021-01-08 LAB — URINE DRUG SCREEN, QUALITATIVE (ARMC ONLY)
Amphetamines, Ur Screen: NOT DETECTED
Barbiturates, Ur Screen: NOT DETECTED
Benzodiazepine, Ur Scrn: NOT DETECTED
Cannabinoid 50 Ng, Ur ~~LOC~~: NOT DETECTED
Cocaine Metabolite,Ur ~~LOC~~: POSITIVE — AB
MDMA (Ecstasy)Ur Screen: NOT DETECTED
Methadone Scn, Ur: NOT DETECTED
Opiate, Ur Screen: NOT DETECTED
Phencyclidine (PCP) Ur S: NOT DETECTED
Tricyclic, Ur Screen: NOT DETECTED

## 2021-01-08 LAB — CHLAMYDIA/NGC RT PCR (ARMC ONLY)
Chlamydia Tr: NOT DETECTED
N gonorrhoeae: DETECTED — AB

## 2021-01-08 LAB — URINALYSIS, ROUTINE W REFLEX MICROSCOPIC
Bilirubin Urine: NEGATIVE
Glucose, UA: NEGATIVE mg/dL
Ketones, ur: NEGATIVE mg/dL
Nitrite: NEGATIVE
Protein, ur: NEGATIVE mg/dL
Specific Gravity, Urine: 1.016 (ref 1.005–1.030)
WBC, UA: >50 WBC/hpf — ABNORMAL HIGH (ref 0–5)
pH: 6 (ref 5.0–8.0)

## 2021-01-08 LAB — CBC
HCT: 40.4 % (ref 39.0–52.0)
Hemoglobin: 13.6 g/dL (ref 13.0–17.0)
MCH: 30.8 pg (ref 26.0–34.0)
MCHC: 33.7 g/dL (ref 30.0–36.0)
MCV: 91.6 fL (ref 80.0–100.0)
Platelets: 254 10*3/uL (ref 150–400)
RBC: 4.41 MIL/uL (ref 4.22–5.81)
RDW: 12.3 % (ref 11.5–15.5)
WBC: 12.7 10*3/uL — ABNORMAL HIGH (ref 4.0–10.5)
nRBC: 0 % (ref 0.0–0.2)

## 2021-01-08 LAB — HEPATIC FUNCTION PANEL
ALT: 16 U/L (ref 0–44)
AST: 26 U/L (ref 15–41)
Albumin: 3.6 g/dL (ref 3.5–5.0)
Alkaline Phosphatase: 46 U/L (ref 38–126)
Bilirubin, Direct: 0.2 mg/dL (ref 0.0–0.2)
Indirect Bilirubin: 0.9 mg/dL (ref 0.3–0.9)
Total Bilirubin: 1.1 mg/dL (ref 0.3–1.2)
Total Protein: 8 g/dL (ref 6.5–8.1)

## 2021-01-08 LAB — BASIC METABOLIC PANEL
Anion gap: 9 (ref 5–15)
BUN: 17 mg/dL (ref 6–20)
CO2: 23 mmol/L (ref 22–32)
Calcium: 8.6 mg/dL — ABNORMAL LOW (ref 8.9–10.3)
Chloride: 102 mmol/L (ref 98–111)
Creatinine, Ser: 1.36 mg/dL — ABNORMAL HIGH (ref 0.61–1.24)
GFR, Estimated: >60 mL/min (ref >60)
Glucose, Bld: 126 mg/dL — ABNORMAL HIGH (ref 70–99)
Potassium: 4.4 mmol/L (ref 3.5–5.1)
Sodium: 134 mmol/L — ABNORMAL LOW (ref 135–145)

## 2021-01-08 LAB — LIPASE, BLOOD: Lipase: 26 U/L (ref 11–51)

## 2021-01-08 LAB — CBG MONITORING, ED: Glucose-Capillary: 122 mg/dL — ABNORMAL HIGH (ref 70–99)

## 2021-01-08 LAB — RESP PANEL BY RT-PCR (FLU A&B, COVID) ARPGX2
Influenza A by PCR: NEGATIVE
Influenza B by PCR: NEGATIVE
SARS Coronavirus 2 by RT PCR: NEGATIVE

## 2021-01-08 LAB — LACTIC ACID, PLASMA: Lactic Acid, Venous: 0.9 mmol/L (ref 0.5–1.9)

## 2021-01-08 LAB — ETHANOL: Alcohol, Ethyl (B): <10 mg/dL (ref <10)

## 2021-01-08 LAB — PROCALCITONIN: Procalcitonin: 0.54 ng/mL

## 2021-01-08 MED ORDER — ONDANSETRON 4 MG PO TBDP: 4.0000 mg | ORAL_TABLET | Freq: Four times a day (QID) | ORAL | 0 refills | Status: DC | PRN

## 2021-01-08 MED ORDER — ACETAMINOPHEN 500 MG PO TABS
1000.0000 mg | ORAL_TABLET | Freq: Once | ORAL | Status: AC
  Administered 2021-01-08: 1000 mg via ORAL
  Filled 2021-01-08: qty 2

## 2021-01-08 MED ORDER — ONDANSETRON HCL 4 MG/2ML IJ SOLN: 4.0000 mg | Freq: Once | INTRAMUSCULAR | Status: AC

## 2021-01-08 MED ORDER — SODIUM CHLORIDE 0.9 % IV SOLN
1.0000 g | Freq: Once | INTRAVENOUS | Status: AC
  Administered 2021-01-08: 1 g via INTRAVENOUS
  Filled 2021-01-08: qty 10

## 2021-01-08 MED ORDER — ONDANSETRON HCL 4 MG/2ML IJ SOLN
4.0000 mg | Freq: Once | INTRAMUSCULAR | Status: AC
  Administered 2021-01-08: 4 mg via INTRAVENOUS
  Filled 2021-01-08: qty 2

## 2021-01-08 MED ORDER — SODIUM CHLORIDE 0.9 % IV BOLUS
1000.0000 mL | Freq: Once | INTRAVENOUS | Status: AC
  Administered 2021-01-08: 1000 mL via INTRAVENOUS

## 2021-01-08 MED ORDER — SODIUM CHLORIDE 0.9 % IV BOLUS (SEPSIS)
1000.0000 mL | Freq: Once | INTRAVENOUS | Status: AC
  Administered 2021-01-08: 1000 mL via INTRAVENOUS

## 2021-01-08 MED ORDER — ONDANSETRON HCL 4 MG/2ML IJ SOLN
INTRAMUSCULAR | Status: AC
  Administered 2021-01-08: 4 mg via INTRAVENOUS
  Filled 2021-01-08: qty 2

## 2021-01-08 MED ORDER — IOHEXOL 300 MG/ML  SOLN
80.0000 mL | Freq: Once | INTRAMUSCULAR | Status: AC | PRN
  Administered 2021-01-08: 80 mL via INTRAVENOUS

## 2021-01-08 MED ORDER — CEPHALEXIN 500 MG PO CAPS: 500.0000 mg | ORAL_CAPSULE | Freq: Two times a day (BID) | ORAL | 0 refills | Status: DC

## 2021-01-08 NOTE — ED Notes (Signed)
Pt transported to CT. Will check CBG upon return

## 2021-01-08 NOTE — ED Provider Notes (Signed)
Patient signed out to me at 7 AM by Dr. Elesa Massed.  In brief he is a 55 year old male presents with some diarrhea nausea vomiting and encephalopathy.  Initially somewhat somnolent but mental status waxing and waning.  Is found to have UTI and leukocytosis but rest of his labs are reassuring.  At the time of signout he is pending CT abdomen pelvis as well as CT head.  CT abdomen pelvis is reassuring, CT head negative and Accu-Chek within normal limits.  On reassessment patient is awake alert and feeling better.  He would like to go home.  Will discharge with Keflex.  He also did test positive for gonorrhea which I informed have of and told him that he should tell his wife and other sexual partners to get tested and treated as well.   Georga Hacking, MD 01/08/21 1040

## 2021-01-08 NOTE — ED Triage Notes (Signed)
Pt presents to triage with family with complaints of increased weakness, lethargy x4 days. Hx of encephalopathy. Pt mucous membranes dry.   Also endorses n/v/d with his sx. No current nausea.

## 2021-01-08 NOTE — ED Notes (Signed)
Pt now having conversations with this RN. Family at bedside. Pt requesting food. Crackers and juice provided at this time.

## 2021-01-08 NOTE — Discharge Instructions (Addendum)
Please take the antibiotic Keflex for your urinary tract infection.  You did test positive for gonorrhea and were treated for this in the emergency department.  Inform your sexual partners of this and tell them to be tested and treated as well.  You may alternate Tylenol 1000 mg every 6 hours as needed for pain, fever and Ibuprofen 800 mg every 8 hours as needed for pain, fever.  Please take Ibuprofen with food.  Do not take more than 4000 mg of Tylenol (acetaminophen) in a 24 hour period.  You may take over-the-counter Imodium as needed for diarrhea.  Steps to find a Primary Care Provider (PCP):  Call [phone] or [phone] to access "[facility] Find a Doctor Service."  2.  You may also go on the [facility] website at [email or web address]

## 2021-01-08 NOTE — ED Provider Notes (Addendum)
[facility] Emergency Department Provider Note  ____________________________________________   Event Date/Time   First MD Initiated Contact with Patient 01/08/21 0425     (approximate)  I have reviewed the triage vital signs and the nursing notes.   HISTORY  Chief Complaint Weakness    HPI Alan Hampton is a 55 y.o. male with history of substance abuse, metabolic encephalopathy who presents to the emergency department with complaints of fevers, headache, cough, nausea, vomiting and diarrhea that started today.  States his friend checked his temperature today prior to arrival and told him he had a fever but he does not remember what it was.  He did not take any medications prior to arrival.  He states he has had multiple sick contacts with similar symptoms.  No chest pain, shortness of breath, abdominal pain, dysuria or hematuria.  No neck pain or neck stiffness.  No numbness, tingling or weakness.        History reviewed. No pertinent past medical history.  Patient Active Problem List   Diagnosis Date Noted   Sepsis (HCC) 06/08/2020   Acute metabolic encephalopathy 06/08/2020   Cocaine use 06/08/2020   AKI (acute kidney injury) (HCC) 06/08/2020    History reviewed. No pertinent surgical history.  Prior to Admission medications   Medication Sig Start Date End Date Taking? Authorizing Provider  cephALEXin (KEFLEX) 500 MG capsule Take 1 capsule (500 mg total) by mouth 2 (two) times daily. 01/08/21  Yes Tyden Kann N, DO  ondansetron (ZOFRAN ODT) 4 MG disintegrating tablet Take 1 tablet (4 mg total) by mouth every 6 (six) hours as needed for nausea or vomiting. 01/08/21  Yes Rashea Hoskie, Layla Maw, DO    Allergies Patient has no known allergies.  History reviewed. No pertinent family history.  Social History Social History   Tobacco Use   Smoking status: Every Day    Packs/day: 0.50    Types: Cigarettes   Smokeless tobacco: Never  Substance Use  Topics   Alcohol use: No   Drug use: No    Review of Systems Constitutional: + fever. Eyes: No visual changes. ENT: No sore throat. Cardiovascular: Denies chest pain. Respiratory: Denies shortness of breath. Gastrointestinal: + nausea, vomiting, diarrhea. Genitourinary: Negative for dysuria. Musculoskeletal: Negative for back pain. Skin: Negative for rash. Neurological: Negative for focal weakness or numbness.  ____________________________________________   PHYSICAL EXAM:  VITAL SIGNS: ED Triage Vitals  Enc Vitals Group     BP 01/08/21 0154 94/64     Pulse Rate 01/08/21 0154 (!) 119     Resp 01/08/21 0154 18     Temp 01/08/21 0154 98.5 F (36.9 C)     Temp Source 01/08/21 0433 Oral     SpO2 01/08/21 0154 95 %     Weight --      Height --      Head Circumference --      Peak Flow --      Pain Score 01/08/21 0433 5     Pain Loc --      Pain Edu? --      Excl. in GC? --    CONSTITUTIONAL: Alert and oriented x 4 and responds appropriately to questions. Well-appearing; well-nourished HEAD: Normocephalic, atraumatic EYES: Conjunctivae clear, pupils appear equal, EOM appear intact ENT: normal nose; moist mucous membranes NECK: Supple, normal ROM, no meningismus CARD: RRR; S1 and S2 appreciated; no murmurs, no clicks, no rubs, no gallops RESP: Normal chest excursion without splinting or tachypnea; breath  sounds clear and equal bilaterally; no wheezes, no rhonchi, no rales, no hypoxia or respiratory distress, speaking full sentences ABD/GI: Normal bowel sounds; non-distended; soft, non-tender, no rebound, no guarding, no peritoneal signs, no hepatosplenomegaly BACK: The back appears normal EXT: Normal ROM in all joints; no deformity noted, no edema; no cyanosis SKIN: Normal color for age and race; warm; no rash on exposed skin NEURO: Moves all extremities equally, normal sensation diffusely, cranial nerves II to XII intact, normal speech PSYCH: The patient's mood and  manner are appropriate.  ____________________________________________   LABS (all labs ordered are listed, but only abnormal results are displayed)  Labs Reviewed  BASIC METABOLIC PANEL - Abnormal; Notable for the following components:      Result Value   Sodium 134 (*)    Glucose, Bld 126 (*)    Creatinine, Ser 1.36 (*)    Calcium 8.6 (*)    All other components within normal limits  CBC - Abnormal; Notable for the following components:   WBC 12.7 (*)    All other components within normal limits  URINALYSIS, ROUTINE W REFLEX MICROSCOPIC - Abnormal; Notable for the following components:   Color, Urine YELLOW (*)    APPearance HAZY (*)    Hgb urine dipstick MODERATE (*)    Leukocytes,Ua LARGE (*)    WBC, UA >50 (*)    Bacteria, UA RARE (*)    All other components within normal limits  URINE DRUG SCREEN, QUALITATIVE ([facility] ONLY) - Abnormal; Notable for the following components:   Cocaine Metabolite,Ur [place] POSITIVE (*)    All other components within normal limits  RESP PANEL BY RT-PCR (FLU A&B, COVID) ARPGX2  CULTURE, BLOOD (ROUTINE X 2)  CULTURE, BLOOD (ROUTINE X 2)  URINE CULTURE  CHLAMYDIA/NGC RT PCR ([facility] ONLY)            ETHANOL  HEPATIC FUNCTION PANEL  PROCALCITONIN  LIPASE, BLOOD  LACTIC ACID, PLASMA   ____________________________________________  EKG   EKG Interpretation  Date/Time:  Saturday January 08 2021 02:01:48 EDT Ventricular Rate:  115 PR Interval:  112 QRS Duration: 76 QT Interval:  308 QTC Calculation: 426 R Axis:   -15 Text Interpretation: Sinus tachycardia Minimal voltage criteria for LVH, may be normal variant ( R in aVL ) Borderline ECG Confirmed by Pryor Curia [phone]) on 01/08/2021 4:27:58 AM        ____________________________________________  RADIOLOGY Jessie Foot Laikynn Pollio, personally viewed and evaluated these images (plain radiographs) as part of my medical decision making, as well as reviewing the written report by the  radiologist.  ED MD interpretation: Chest x-ray clear.  Official radiology report(s): DG Chest 2 View  Result Date: 01/08/2021 CLINICAL DATA:  Fever, cough EXAM: CHEST - 2 VIEW COMPARISON:  06/08/2020 chest radiograph. FINDINGS: Stable cardiomediastinal silhouette with normal heart size. No pneumothorax. No pleural effusion. Lungs appear clear, with no acute consolidative airspace disease and no pulmonary edema. IMPRESSION: No active cardiopulmonary disease. Electronically Signed   By: Ilona Sorrel M.D.   On: 01/08/2021 05:55    ____________________________________________   PROCEDURES  Procedure(s) performed (including Critical Care):  Procedures   ____________________________________________   INITIAL IMPRESSION / ASSESSMENT AND PLAN / ED COURSE  As part of my medical decision making, I reviewed the following data within the [facility] notes reviewed and incorporated, Labs reviewed , EKG interpreted , Old EKG reviewed, Old chart reviewed, Radiograph reviewed and CT reviewed, and Notes from prior ED visits  Patient here with nausea, vomiting and diarrhea.  Also reports fever at home today, headache, cough.  Suspect viral illness.  He is otherwise well-appearing, nontoxic.  Initially was slightly tachycardic with low blood pressure but received IV fluids and this has improved.  He has been afebrile here.  No meningismus.  Does have history of acute encephalopathy and has history of substance abuse but is currently alert and oriented, arousable, neurologically intact.  Abdominal exam is benign.  Doubt appendicitis, colitis, diverticulitis, perforation, bowel obstruction, cholecystitis, pancreatitis, UTI, pyelonephritis.  Labs obtained in triage show mild leukocytosis which appears to be a chronic issue for patient.  He also has a mild AKI.  We will continue to hydrate.  Will give Tylenol, Zofran for symptomatic relief.  Will encourage oral fluids.  Given  patient has had cough, will obtain chest x-ray, COVID and flu swabs.  He has not hypoxic here.  He has not encephalopathic currently.  ED PROGRESS  Patient's procalcitonin has come back slightly elevated at 0.54.  COVID and flu swabs negative.  Chest x-ray clear and urine shows no sign of infection.  Will obtain CT of the abdomen pelvis for further evaluation for any surgical process or other process that may need antibiotics such as cholecystitis, appendicitis, colitis, diverticulitis.  Will obtain lactic and blood cultures.   7:10 AM  Pt's urine appears infected.  Urine culture pending.  We will add on gonorrhea and chlamydia.  No previous positive urine cultures in our system.  Will give IV Rocephin here.  Patient's drug screen is positive for cocaine.  CT reviewed by myself and I do not appreciate any sign of kidney stone, pyelonephritis or other acute abnormality but official radiology read pending.  If CT unremarkable, will p.o. challenge.  Signed out to oncoming ED physician.  I reviewed all nursing notes and pertinent previous records as available.  I have reviewed and interpreted any EKGs, lab and urine results, imaging (as available).   7:35 AM  On my reevaluation of patient, he appears to be more somnolent than he was previously.  Patient has history of encephalopathy in the setting of bacterial infection.  Will obtain CT of the head but patient may require hospitalization if mental status is not improving. ____________________________________________   FINAL CLINICAL IMPRESSION(S) / ED DIAGNOSES  Final diagnoses:  Nausea, vomiting and diarrhea  AKI (acute kidney injury) ([place])  Acute UTI     ED Discharge Orders          Ordered    cephALEXin (KEFLEX) 500 MG capsule  2 times daily        01/08/21 0719    ondansetron (ZOFRAN ODT) 4 MG disintegrating tablet  Every 6 hours PRN        01/08/21 0719            *Please note:  Detrich Fayer [facility] was evaluated in Emergency  Department on 01/08/2021 for the symptoms described in the history of present illness. He was evaluated in the context of the global COVID-19 pandemic, which necessitated consideration that the patient might be at risk for infection with the SARS-CoV-2 virus that causes COVID-19. Institutional protocols and algorithms that pertain to the evaluation of patients at risk for COVID-19 are in a state of rapid change based on information released by regulatory bodies including the CDC and federal and state organizations. These policies and algorithms were followed during the patient's care in the ED.  Some ED evaluations and interventions may be delayed as a  result of limited staffing during and the pandemic.*   Note:  This document was prepared using Dragon voice recognition software and may include unintentional dictation errors.    Ilina Xu, Delice Bison, DO 01/08/21 0719    Lisl Slingerland, Delice Bison, DO 01/08/21 [phone]

## 2021-01-09 LAB — URINE CULTURE: Culture: NO GROWTH

## 2021-01-13 LAB — CULTURE, BLOOD (ROUTINE X 2)
Culture: NO GROWTH
Culture: NO GROWTH
Special Requests: ADEQUATE

## 2021-11-19 ENCOUNTER — Other Ambulatory Visit: Payer: Self-pay

## 2021-11-19 DIAGNOSIS — Z20822 Contact with and (suspected) exposure to covid-19: Secondary | ICD-10-CM | POA: Diagnosis present

## 2021-11-19 DIAGNOSIS — A740 Chlamydial conjunctivitis: Principal | ICD-10-CM | POA: Diagnosis present

## 2021-11-19 DIAGNOSIS — H109 Unspecified conjunctivitis: Secondary | ICD-10-CM

## 2021-11-19 DIAGNOSIS — F17210 Nicotine dependence, cigarettes, uncomplicated: Secondary | ICD-10-CM | POA: Diagnosis present

## 2021-11-19 DIAGNOSIS — F1410 Cocaine abuse, uncomplicated: Secondary | ICD-10-CM | POA: Diagnosis present

## 2021-11-19 DIAGNOSIS — G9341 Metabolic encephalopathy: Secondary | ICD-10-CM | POA: Diagnosis present

## 2021-11-19 HISTORY — DX: Unspecified conjunctivitis: H10.9

## 2021-11-19 NOTE — ED Triage Notes (Signed)
Patient reports headache and his eyes burning for the past month.  Reports last time he had these symptoms told he had fluid on his brain.

## 2021-11-20 ENCOUNTER — Encounter: Payer: Self-pay | Admitting: Internal Medicine

## 2021-11-20 ENCOUNTER — Emergency Department: Payer: Self-pay

## 2021-11-20 ENCOUNTER — Inpatient Hospital Stay
Admission: EM | Admit: 2021-11-20 | Discharge: 2021-11-21 | DRG: 124 | Disposition: A | Discharge status: Home / Self Care | Payer: Self-pay | Attending: Internal Medicine | Admitting: Internal Medicine

## 2021-11-20 DIAGNOSIS — H109 Unspecified conjunctivitis: Secondary | ICD-10-CM | POA: Diagnosis present

## 2021-11-20 DIAGNOSIS — G9341 Metabolic encephalopathy: Secondary | ICD-10-CM

## 2021-11-20 DIAGNOSIS — A740 Chlamydial conjunctivitis: Secondary | ICD-10-CM

## 2021-11-20 DIAGNOSIS — R4182 Altered mental status, unspecified: Principal | ICD-10-CM

## 2021-11-20 DIAGNOSIS — A5431 Gonococcal conjunctivitis: Secondary | ICD-10-CM

## 2021-11-20 DIAGNOSIS — F1490 Cocaine use, unspecified, uncomplicated: Secondary | ICD-10-CM | POA: Diagnosis present

## 2021-11-20 LAB — URINE DRUG SCREEN, QUALITATIVE (ARMC ONLY)
Amphetamines, Ur Screen: NOT DETECTED
Barbiturates, Ur Screen: NOT DETECTED
Benzodiazepine, Ur Scrn: NOT DETECTED
Cannabinoid 50 Ng, Ur ~~LOC~~: NOT DETECTED
Cocaine Metabolite,Ur ~~LOC~~: POSITIVE — AB
MDMA (Ecstasy)Ur Screen: NOT DETECTED
Methadone Scn, Ur: NOT DETECTED
Opiate, Ur Screen: NOT DETECTED
Phencyclidine (PCP) Ur S: NOT DETECTED
Tricyclic, Ur Screen: NOT DETECTED

## 2021-11-20 LAB — CBC WITH DIFFERENTIAL/PLATELET
Abs Immature Granulocytes: 0.01 10*3/uL (ref 0.00–0.07)
Basophils Absolute: 0 10*3/uL (ref 0.0–0.1)
Basophils Relative: 0 %
Eosinophils Absolute: 0.2 10*3/uL (ref 0.0–0.5)
Eosinophils Relative: 5 %
HCT: 35.8 % — ABNORMAL LOW (ref 39.0–52.0)
Hemoglobin: 11.4 g/dL — ABNORMAL LOW (ref 13.0–17.0)
Immature Granulocytes: 0 %
Lymphocytes Relative: 40 %
Lymphs Abs: 2.1 10*3/uL (ref 0.7–4.0)
MCH: 30.2 pg (ref 26.0–34.0)
MCHC: 31.8 g/dL (ref 30.0–36.0)
MCV: 94.7 fL (ref 80.0–100.0)
Monocytes Absolute: 0.6 10*3/uL (ref 0.1–1.0)
Monocytes Relative: 12 %
Neutro Abs: 2.2 10*3/uL (ref 1.7–7.7)
Neutrophils Relative %: 43 %
Platelets: 243 10*3/uL (ref 150–400)
RBC: 3.78 MIL/uL — ABNORMAL LOW (ref 4.22–5.81)
RDW: 12.6 % (ref 11.5–15.5)
WBC: 5.2 10*3/uL (ref 4.0–10.5)
nRBC: 0 % (ref 0.0–0.2)

## 2021-11-20 LAB — HIV ANTIBODY (ROUTINE TESTING W REFLEX): HIV Screen 4th Generation wRfx: NONREACTIVE

## 2021-11-20 LAB — PROCALCITONIN: Procalcitonin: <0.1 ng/mL

## 2021-11-20 LAB — COMPREHENSIVE METABOLIC PANEL
ALT: 17 U/L (ref 0–44)
AST: 20 U/L (ref 15–41)
Albumin: 3.6 g/dL (ref 3.5–5.0)
Alkaline Phosphatase: 61 U/L (ref 38–126)
Anion gap: 6 (ref 5–15)
BUN: 11 mg/dL (ref 6–20)
CO2: 25 mmol/L (ref 22–32)
Calcium: 8.6 mg/dL — ABNORMAL LOW (ref 8.9–10.3)
Chloride: 112 mmol/L — ABNORMAL HIGH (ref 98–111)
Creatinine, Ser: 1.06 mg/dL (ref 0.61–1.24)
GFR, Estimated: >60 mL/min (ref >60)
Glucose, Bld: 130 mg/dL — ABNORMAL HIGH (ref 70–99)
Potassium: 3.5 mmol/L (ref 3.5–5.1)
Sodium: 143 mmol/L (ref 135–145)
Total Bilirubin: 0.4 mg/dL (ref 0.3–1.2)
Total Protein: 6.9 g/dL (ref 6.5–8.1)

## 2021-11-20 LAB — BLOOD GAS, VENOUS
Acid-Base Excess: 2.8 mmol/L — ABNORMAL HIGH (ref 0.0–2.0)
Bicarbonate: 29.4 mmol/L — ABNORMAL HIGH (ref 20.0–28.0)
O2 Saturation: 57.5 %
Patient temperature: 37
pCO2, Ven: 52 mmHg (ref 44–60)
pH, Ven: 7.36 (ref 7.25–7.43)
pO2, Ven: 35 mmHg (ref 32–45)

## 2021-11-20 LAB — ETHANOL: Alcohol, Ethyl (B): <10 mg/dL (ref <10)

## 2021-11-20 LAB — URINALYSIS, ROUTINE W REFLEX MICROSCOPIC
Bacteria, UA: NONE SEEN
Bilirubin Urine: NEGATIVE
Glucose, UA: NEGATIVE mg/dL
Hgb urine dipstick: NEGATIVE
Ketones, ur: NEGATIVE mg/dL
Nitrite: NEGATIVE
Protein, ur: NEGATIVE mg/dL
Specific Gravity, Urine: 1.021 (ref 1.005–1.030)
Squamous Epithelial / HPF: NONE SEEN (ref 0–5)
WBC, UA: >50 WBC/hpf — ABNORMAL HIGH (ref 0–5)
pH: 6 (ref 5.0–8.0)

## 2021-11-20 LAB — CHLAMYDIA/NGC RT PCR (ARMC ONLY)
Chlamydia Tr: DETECTED — AB
N gonorrhoeae: DETECTED — AB

## 2021-11-20 LAB — SARS CORONAVIRUS 2 BY RT PCR: SARS Coronavirus 2 by RT PCR: NEGATIVE

## 2021-11-20 LAB — LACTIC ACID, PLASMA: Lactic Acid, Venous: 1.1 mmol/L (ref 0.5–1.9)

## 2021-11-20 MED ORDER — SODIUM CHLORIDE 0.9 % IV SOLN
1.0000 g | Freq: Once | INTRAVENOUS | Status: AC
  Administered 2021-11-20: 1 g via INTRAVENOUS
  Filled 2021-11-20: qty 10

## 2021-11-20 MED ORDER — TOBRAMYCIN 0.3 % OP SOLN
2.0000 [drp] | Freq: Every day | OPHTHALMIC | Status: DC
  Administered 2021-11-20 – 2021-11-21 (×6): 2 [drp] via OPHTHALMIC
  Filled 2021-11-20: qty 5

## 2021-11-20 MED ORDER — FLUORESCEIN SODIUM 1 MG OP STRP
1.0000 | ORAL_STRIP | Freq: Once | OPHTHALMIC | Status: AC
  Administered 2021-11-20: 1 via OPHTHALMIC
  Filled 2021-11-20: qty 1

## 2021-11-20 MED ORDER — ENOXAPARIN SODIUM 40 MG/0.4ML IJ SOSY
40.0000 mg | PREFILLED_SYRINGE | INTRAMUSCULAR | Status: DC
  Administered 2021-11-20: 40 mg via SUBCUTANEOUS
  Filled 2021-11-20: qty 0.4

## 2021-11-20 MED ORDER — LACTATED RINGERS IV BOLUS
1000.0000 mL | Freq: Once | INTRAVENOUS | Status: AC
  Administered 2021-11-20: 1000 mL via INTRAVENOUS

## 2021-11-20 MED ORDER — ACETAMINOPHEN 500 MG PO TABS
1000.0000 mg | ORAL_TABLET | Freq: Once | ORAL | Status: AC
  Administered 2021-11-20: 1000 mg via ORAL
  Filled 2021-11-20: qty 2

## 2021-11-20 MED ORDER — LORAZEPAM 0.5 MG PO TABS: 0.5000 mg | ORAL_TABLET | Freq: Four times a day (QID) | ORAL | Status: DC | PRN

## 2021-11-20 MED ORDER — ERYTHROMYCIN 5 MG/GM OP OINT
TOPICAL_OINTMENT | Freq: Once | OPHTHALMIC | Status: AC
  Administered 2021-11-20: 1 via OPHTHALMIC
  Filled 2021-11-20: qty 1

## 2021-11-20 MED ORDER — SODIUM CHLORIDE 0.9 % IV SOLN: INTRAVENOUS | Status: DC

## 2021-11-20 NOTE — ED Notes (Signed)
EDP McHugh in room at this time. 

## 2021-11-20 NOTE — Subjective & Objective (Signed)
Mr. Moltz, a 56 y/o w/ h/o cocaine abuse. He was seen in 2022 for AKI, UTI, GC, altered mental status w/ metabolic encephalopathy with negative LP. He is brought to [facility]ED by friends for evaluation of increased somnolence, decreased responsiveness and drainage from his eyes. No report of fever, rigors, change in vision, loss of sensation or focal neurologic symptoms.

## 2021-11-20 NOTE — ED Notes (Signed)
Pt taken to CT at this time.

## 2021-11-20 NOTE — Plan of Care (Signed)
  Problem: Coping: Goal: Level of anxiety will decrease Outcome: Progressing   Problem: Pain Managment: Goal: General experience of comfort will improve Outcome: Progressing   Problem: Safety: Goal: Ability to remain free from injury will improve Outcome: Progressing   

## 2021-11-20 NOTE — ED Provider Notes (Signed)
[facility] Provider Note    Event Date/Time   First MD Initiated Contact with Patient 11/20/21 0034     (approximate)   History   Eyes Burning   HPI  SHAMMAH BORNT is a 56 y.o. male last medical history of cocaine use disorder with eye pain and headache.  Patient is accompanied by his girlfriend and friend.  They say he was less responsive tonight.  Patient tells me he has had a headache for several years and his eyes were burning tonight.  He is also had a cough.  Denies fevers or chills.  Denies visual changes numbness tingling weakness.  Patient denies any active drug use currently.  He is otherwise limited by his mental status.  Patient had admission last year in April for altered mental status metabolic encephalopathy and fever.  He had an LP that was negative for bacterial meningitis.  Ultimately was discharged on p.o. antibiotics.  See he was also seen in the ED about a year ago for UTI and at that time had waxing and waning mental status but ultimately improved and was able to be discharged on p.o. antibiotics.     No past medical history on file.  Patient Active Problem List   Diagnosis Date Noted   Sepsis ([place]) [id number]   Acute metabolic encephalopathy [id number]   Cocaine use 06/08/2020   AKI (acute kidney injury) ([place]) 06/08/2020     Physical Exam  Triage Vital Signs: ED Triage Vitals  Enc Vitals Group     BP 11/19/21 2348 131/88     Pulse Rate 11/19/21 2348 92     Resp 11/19/21 2348 20     Temp 11/19/21 2348 99 F (37.2 C)     Temp Source 11/19/21 2348 Oral     SpO2 11/19/21 2348 94 %     Weight 11/19/21 2350 140 lb (63.5 kg)     Height 11/19/21 2350 5\' 10"  (1.778 m)     Head Circumference --      Peak Flow --      Pain Score 11/20/21 0058 7     Pain Loc --      Pain Edu? --      Excl. in [place]? --     Most recent vital signs: Vitals:   11/20/21 0450 11/20/21 0530  BP: 117/82 100/66  Pulse: 76 82  Resp: 16 20  Temp:  98.2 F (36.8 C)   SpO2: 99% 98%     General: Awake, no distress.  CV:  Good peripheral perfusion.  Resp:  Normal effort.  Patient has frequent cough, breath sounds equal bilaterally no increased work of breathing Abd:  No distention.  Abdomen is soft and nontender Neuro:             Awake, Alert, Oriented x 3  Other:  Patient's eyes are closed, he awakens to voice and is oriented x3 and follows commands but he does frequently fall asleep and needs to be woken up repetitively Aox3, nml speech  PERRL, EOMI, face symmetric, nml tongue movement  5/5 strength in the BL upper and lower extremities  Sensation grossly intact in the BL upper and lower extremities  Finger-nose-finger intact BL  Bilateral eyes with green drainage, right eyelids are stuck shut Right upper eyelid is mildly swollen but not erythematous Conjunctiva is injected bilaterally right greater than left Extraocular movements are intact, no pain with extraocular movements, no proptosis No fluorescein uptake bilaterally  ED Results /  Procedures / Treatments  Labs (all labs ordered are listed, but only abnormal results are displayed) Labs Reviewed  CBC WITH DIFFERENTIAL/PLATELET - Abnormal; Notable for the following components:      Result Value   RBC 3.78 (*)    Hemoglobin 11.4 (*)    HCT 35.8 (*)    All other components within normal limits  COMPREHENSIVE METABOLIC PANEL - Abnormal; Notable for the following components:   Chloride 112 (*)    Glucose, Bld 130 (*)    Calcium 8.6 (*)    All other components within normal limits  URINALYSIS, ROUTINE W REFLEX MICROSCOPIC - Abnormal; Notable for the following components:   Color, Urine YELLOW (*)    APPearance CLEAR (*)    Leukocytes,Ua SMALL (*)    WBC, UA >50 (*)    All other components within normal limits  URINE DRUG SCREEN, QUALITATIVE ([facility] ONLY) - Abnormal; Notable for the following components:   Cocaine Metabolite,Ur [place] POSITIVE (*)    All other components  within normal limits  SARS CORONAVIRUS 2 BY RT PCR  CHLAMYDIA/NGC RT PCR ([facility] ONLY)            LACTIC ACID, PLASMA  PROCALCITONIN  ETHANOL     EKG  EKG interpretation performed by myself: NSR, nml axis, nml intervals, no acute ischemic changes    RADIOLOGY I reviewed and interpreted the CT scan of the brain which does not show any acute intracranial process    PROCEDURES:  Critical Care performed: No  Procedures  The patient is on the cardiac monitor to evaluate for evidence of arrhythmia and/or significant heart rate changes.   MEDICATIONS ORDERED IN ED: Medications  erythromycin ophthalmic ointment (has no administration in time range)  fluorescein ophthalmic strip 1 strip (1 strip Both Eyes Given 11/20/21 0239)  lactated ringers bolus 1,000 mL (0 mLs Intravenous Stopped 11/20/21 0245)  acetaminophen (TYLENOL) tablet 1,000 mg (1,000 mg Oral Given 11/20/21 0211)  cefTRIAXone (ROCEPHIN) 1 g in sodium chloride 0.9 % 100 mL IVPB (0 g Intravenous Stopped 11/20/21 0557)     IMPRESSION / MDM / ASSESSMENT AND PLAN / ED COURSE  I reviewed the triage vital signs and the nursing notes.                              Patient's presentation is most consistent with acute presentation with potential threat to life or bodily function.  Differential diagnosis includes, but is not limited to, intoxication, meningitis/encephalitis, intracranial hemorrhage, viral illness including COVID-19  The patient is a 56 year old male presents today because of headache and altered mental status.  He is girlfriend says that he was in difficulty staying awake tonight which is primarily with they brought him in.  Patient tells me he has had a headache but this has been going on for a year, and is not new.  He does endorse bilateral eye pain and burning.  Somewhat unclear when this started.  He denies any trauma.  He has also had cough.  His girlfriend tells me that he presented similarly and they told him  he had fluid on his brain.  I do not see any record of this.  I do see that he had an admission last year for altered mental status and fever and had an LP that was reassuring but there was no mention of any abnormal fluid or any bleeding in the brain.  Patient's vitals are reassuring.  On exam he has bilateral matting of his eyelids due to exudate.  The right eye is injected more than the left.  The pupils are reactive he has normal extraocular movements there is no uptake on fluorescein exam.  Suspect conjunctivitis bacterial certainly possible.  SHEENT is somewhat somnolent but awakens appropriately and is alert and oriented with a nonfocal neurologic exam.  Does fall asleep repeatedly woken up easily to voice.  He is coughing frequently but his lungs sound clear.  Will check labs CT head chest x-ray COVID test.  Patient has had history of cocaine use question whether that is playing into his presentation today.  Will check UDS and ethanol level.  Labs are overall reassuring.  He has no leukocytosis.  Procalcitonin is negative lactate is negative.  Ethanol level is negative.  CMP reassuring.  Creatinine improved from prior.  COVID test is negative.  CT head is without acute findings.  Chest x-ray without evidence of pneumonia.  On reassessment mental status is somewhat improved tells me his headache feels fair.  He still pending UDS.  He is receiving fluids and Tylenol.  UA with greater than 50 WBCs.  We will send urine culture.  I see that back in November he tested positive for gonorrhea.  We will send GC chlamydia again.  If patient did have gonorrhea chlamydia this could also cause a conjunctivitis.  We will give a gram of ceftriaxone.  I reassessed the patient multiple times and his mental status did not significant improved throughout his 6-1/2-hour ED stay.  He is sleeping and does awaken with light touch but then immediately falls back asleep and can only answer very brief questions before falling  back asleep.  His UDS is positive for cocaine I suppose that if he was coming down from using cocaine this could make him sleepy but otherwise except been to be more hyperactive if he was intoxicated.  Considered meningitis/encephalitis given his headache however he has no leukocytosis negative procalcitonin no fever feel that this is less likely.  Clinical Course as of 11/20/21 [id number]  Sun Nov 20, 2021  0239 SARS Coronavirus 2 by RT PCR (hospital order, performed in [facility] hospital lab) *cepheid single result test* Anterior Nasal Swab [KM]    Clinical Course User Index [KM] Rada Hay, MD     FINAL CLINICAL IMPRESSION(S) / ED DIAGNOSES   Final diagnoses:  Altered mental status, unspecified altered mental status type     Rx / DC Orders   ED Discharge Orders     None        Note:  This document was prepared using Dragon voice recognition software and may include unintentional dictation errors.   Rada Hay, MD 11/20/21 [phone]

## 2021-11-20 NOTE — Assessment & Plan Note (Signed)
Recurrent use/abuse. May contribute to encephalopathy above  Plan Abuse counseling  Recommendation for NA attendance  Ativan prn agitation

## 2021-11-20 NOTE — Assessment & Plan Note (Addendum)
Patient with persistent somnolence, but is arouseable. Non-focal exam: normal neck movement, nl DTRs. Procalcitonin <0/.1, WBC 5.2, afebrile, lac 1.1 making meningitis, encephalitis less likely. Cocaine on board.   Plan Med-surg admit  Neuro checks q 4  Hydrate

## 2021-11-20 NOTE — ED Notes (Signed)
Advised nurse that patient has ready bed 

## 2021-11-20 NOTE — H&P (Addendum)
History and Physical    Salome Cozby [facility] TMA:[id number] DOB: 1966-01-31 DOA: 11/20/2021  DOS: the patient was seen and examined on 11/20/2021  PCP: Patient, No Pcp Per   Patient coming from: Home  I have personally briefly reviewed patient's old medical records in [facility]  Mr. Duman, a 56 y/o w/ h/o cocaine abuse. He was seen in 2022 for AKI, UTI, GC, altered mental status w/ metabolic encephalopathy with negative LP. He is brought to [facility] by friends for evaluation of increased somnolence, decreased responsiveness and drainage from his eyes. No report of fever, rigors, change in vision, loss of sensation or focal neurologic symptoms.    ED Course: afebrile, 100/66  82  20. Patient somnolent with non-focal exam, swelling both eye lids, exudate both eyes per EDP. Lab: VBG 7.36/52/35  glucose 130, Lactic acid 1.1  Procalcitonin <0.10, WBC 5.2 with nl diff. U/A no bacteria, > 50 WBC, UDS + cocaine. CT head negative. CXR NAD. TRH called to admit for further evaluation and treatment of conjunctivitis, encephalopathy  Review of Systems:  Review of Systems  Constitutional: Negative.   HENT: Negative.    Eyes:  Positive for blurred vision, pain, discharge (swollen lids, matting OU, thick-purlent exudative discharge OU) and redness (injected bulbar conjunctiva).  Respiratory: Negative.    Cardiovascular: Negative.   Gastrointestinal: Negative.   Genitourinary: Negative.        Specifically denies penile discharge  Musculoskeletal:  Positive for myalgias (diffuse minor aches). Negative for joint pain.  Skin: Negative.   Neurological:  Positive for headaches (frontal, rated as 5/10). Negative for sensory change, speech change and focal weakness.  Endo/Heme/Allergies: Negative.   Psychiatric/Behavioral: Negative.      Past Medical History:  Diagnosis Date   AKI (acute kidney injury) (HCC)    2022 - resolved   Bacterial conjunctivitis of both eyes 11/19/2021   Cocaine abuse (HCC)     History of encephalopathy    2022, presents same 11/19/21    Soc Hx - marriages #1 and #2 ended in divorce. Marriage #3 - 12 years. No children. Work - Librarian, academic.   History reviewed. No pertinent surgical history.   reports that he has been smoking cigarettes. He has been smoking an average of .5 packs per day. He has never used smokeless tobacco. He reports that he does not drink alcohol and does not use drugs.  No Known Allergies  History reviewed. No pertinent family history.  Prior to Admission medications   Medication Sig Start Date End Date Taking? Authorizing Provider  cephALEXin (KEFLEX) 500 MG capsule Take 1 capsule (500 mg total) by mouth 2 (two) times daily. Patient not taking: Reported on 11/20/2021 01/08/21   Ward, Layla Maw, DO  ondansetron (ZOFRAN ODT) 4 MG disintegrating tablet Take 1 tablet (4 mg total) by mouth every 6 (six) hours as needed for nausea or vomiting. Patient not taking: Reported on 11/20/2021 01/08/21   Ward, Layla Maw, DO    Physical Exam: Vitals:   11/20/21 0530 11/20/21 0700 11/20/21 0800 11/20/21 0814  BP: 100/66 100/71 119/89   Pulse: 82 79 90   Resp: 20 13 18    Temp:    97.6 F (36.4 C)  TempSrc:    Oral  SpO2: 98% 99% 100%   Weight:      Height:        Physical Exam Vitals and nursing note reviewed.  Constitutional:      General: He is not in acute distress.  Appearance: Normal appearance. He is normal weight. He is not toxic-appearing.     Comments: Somnolent but easily arouseable. Follows commands. Speech clear. Falls asleep between instructions/questions.  HENT:     Head: Normocephalic and atraumatic.     Nose: Nose normal.     Mouth/Throat:     Mouth: Mucous membranes are moist.     Comments: Missing many teeth. No dentures, no oral lesions Eyes:     General: No scleral icterus.       Right eye: Discharge (thick exudative discharge) present.        Left eye: Discharge (thick exudative discharge) present.    Pupils:  Pupils are equal, round, and reactive to light.     Comments: Swollen eyes. Exam hindered by swelling and difficulty fully opening eyes.   Cardiovascular:     Rate and Rhythm: Normal rate and regular rhythm.     Pulses: Normal pulses.     Heart sounds: Normal heart sounds.  Pulmonary:     Effort: Pulmonary effort is normal.     Breath sounds: Normal breath sounds.  Abdominal:     General: Abdomen is flat. Bowel sounds are normal.     Palpations: Abdomen is soft.  Musculoskeletal:        General: Deformity present. No swelling. Normal range of motion.     Cervical back: Normal range of motion and neck supple. No rigidity or tenderness.     Right lower leg: No edema.     Left lower leg: No edema.  Lymphadenopathy:     Cervical: No cervical adenopathy.  Skin:    General: Skin is warm and dry.     Findings: No bruising or lesion.     Comments: Onychyomycosis great nails. Very dry skin both feet  Neurological:     Mental Status: He is oriented to person, place, and time.     Motor: No weakness.     Deep Tendon Reflexes: Reflexes normal.     Comments: Somnolent, easily arouseable. Follows commands. Speech slow but clear. No focal weakness  Psychiatric:        Mood and Affect: Mood normal.        Thought Content: Thought content normal.     Comments: somnolent      Labs on Admission: I have personally reviewed following labs and imaging studies  CBC: Recent Labs  Lab 11/19/21 2355  WBC 5.2  NEUTROABS 2.2  HGB 11.4*  HCT 35.8*  MCV 94.7  PLT 322   Basic Metabolic Panel: Recent Labs  Lab 11/19/21 2355  NA 143  K 3.5  CL 112*  CO2 25  GLUCOSE 130*  BUN 11  CREATININE 1.06  CALCIUM 8.6*   GFR: Estimated Creatinine Clearance: 71.6 mL/min (by C-G formula based on SCr of 1.06 mg/dL). Liver Function Tests: Recent Labs  Lab 11/19/21 2355  AST 20  ALT 17  ALKPHOS 61  BILITOT 0.4  PROT 6.9  ALBUMIN 3.6   No results for input(s): "LIPASE", "AMYLASE" in the last  168 hours. No results for input(s): "AMMONIA" in the last 168 hours. Coagulation Profile: No results for input(s): "INR", "PROTIME" in the last 168 hours. Cardiac Enzymes: No results for input(s): "CKTOTAL", "CKMB", "CKMBINDEX", "TROPONINI" in the last 168 hours. BNP (last 3 results) No results for input(s): "PROBNP" in the last 8760 hours. HbA1C: No results for input(s): "HGBA1C" in the last 72 hours. CBG: No results for input(s): "GLUCAP" in the last 168 hours. Lipid Profile: No  results for input(s): "CHOL", "HDL", "LDLCALC", "TRIG", "CHOLHDL", "LDLDIRECT" in the last 72 hours. Thyroid Function Tests: No results for input(s): "TSH", "T4TOTAL", "FREET4", "T3FREE", "THYROIDAB" in the last 72 hours. Anemia Panel: No results for input(s): "VITAMINB12", "FOLATE", "FERRITIN", "TIBC", "IRON", "RETICCTPCT" in the last 72 hours. Urine analysis:    Component Value Date/Time   COLORURINE YELLOW (A) 11/20/2021 0052   APPEARANCEUR CLEAR (A) 11/20/2021 0052   LABSPEC 1.021 11/20/2021 0052   PHURINE 6.0 11/20/2021 0052   GLUCOSEU NEGATIVE 11/20/2021 0052   HGBUR NEGATIVE 11/20/2021 0052   BILIRUBINUR NEGATIVE 11/20/2021 0052   KETONESUR NEGATIVE 11/20/2021 0052   PROTEINUR NEGATIVE 11/20/2021 0052   NITRITE NEGATIVE 11/20/2021 0052   LEUKOCYTESUR SMALL (A) 11/20/2021 0052    Radiological Exams on Admission: I have personally reviewed images DG Chest Portable 1 View  Result Date: 11/20/2021 CLINICAL DATA:  Shortness of breath. EXAM: PORTABLE CHEST 1 VIEW COMPARISON:  Chest x-ray 01/08/2021 FINDINGS: The heart size and mediastinal contours are within normal limits. Both lungs are clear. The visualized skeletal structures are unremarkable. IMPRESSION: No active disease. Electronically Signed   By: Darliss Cheney M.D.   On: 11/20/2021 01:26   CT HEAD WO CONTRAST ( )  Result Date: 11/20/2021 CLINICAL DATA:  Altered mental status EXAM: CT HEAD WITHOUT CONTRAST TECHNIQUE: Contiguous axial  images were obtained from the base of the skull through the vertex without intravenous contrast. RADIATION DOSE REDUCTION: This exam was performed according to the departmental dose-optimization program which includes automated exposure control, adjustment of the mA and/or kV according to patient size and/or use of iterative reconstruction technique. COMPARISON:  01/08/2021 FINDINGS: Brain: No evidence of acute infarction, hemorrhage, hydrocephalus, extra-axial collection or mass lesion/mass effect. Vascular: No hyperdense vessel or unexpected calcification. Skull: Normal. Negative for fracture or focal lesion. Sinuses/Orbits: No acute finding. Other: None. IMPRESSION: No acute intracranial abnormality noted. Electronically Signed   By: Alcide Clever M.D.   On: 11/20/2021 01:20    EKG: I have personally reviewed EKG: NSR, no acute changes  Assessment/Plan Principal Problem:   Acute metabolic encephalopathy Active Problems:   Conjunctivitis   Cocaine use    Assessment and Plan: * Acute metabolic encephalopathy Patient with persistent somnolence, but is arouseable. Non-focal exam: normal neck movement, nl DTRs. Procalcitonin <0/.1, WBC 5.2, afebrile, lac 1.1 making meningitis, encephalitis less likely. Cocaine on board.   Plan Med-surg admit  Neuro checks q 4  Hydrate  Conjunctivitis Presents with crusted conjunctival exudate bilaterally along with injection bulbar conjunctiva. H/o GC. Given 1 gram Rocephin in ED while GC culture pending.  Plan Tobramycin opthal drops 5/day  Warm compress to eyes 10 minute every 2 hrs while awake  Cocaine use Recurrent use/abuse. May contribute to encephalopathy above  Plan Abuse counseling  Recommendation for NA attendance  Ativan prn agitation       DVT prophylaxis: Lovenox Code Status: Full Code Family Communication: wife present during exam  Disposition Plan: home 24-48 hr. TOC for referral to NA  Consults called: none  Admission status:  Observation, Med-Surg   Illene Regulus, MD [facility] 11/20/2021, 8:23 AM

## 2021-11-20 NOTE — Assessment & Plan Note (Addendum)
Presents with crusted conjunctival exudate bilaterally along with injection bulbar conjunctiva. H/o GC. Given 1 gram Rocephin in ED while GC culture pending.  Plan Tobramycin opthal drops 5/day  Warm compress to eyes 10 minute every 2 hrs while awake

## 2021-11-20 NOTE — ED Notes (Signed)
Pt able to stand an urinate in urinal w/assistance.

## 2021-11-21 ENCOUNTER — Other Ambulatory Visit: Payer: Self-pay

## 2021-11-21 DIAGNOSIS — A5431 Gonococcal conjunctivitis: Secondary | ICD-10-CM

## 2021-11-21 DIAGNOSIS — A740 Chlamydial conjunctivitis: Principal | ICD-10-CM

## 2021-11-21 DIAGNOSIS — F1490 Cocaine use, unspecified, uncomplicated: Secondary | ICD-10-CM

## 2021-11-21 LAB — BASIC METABOLIC PANEL
Anion gap: 3 — ABNORMAL LOW (ref 5–15)
BUN: 11 mg/dL (ref 6–20)
CO2: 25 mmol/L (ref 22–32)
Calcium: 7.8 mg/dL — ABNORMAL LOW (ref 8.9–10.3)
Chloride: 113 mmol/L — ABNORMAL HIGH (ref 98–111)
Creatinine, Ser: 1.21 mg/dL (ref 0.61–1.24)
GFR, Estimated: >60 mL/min (ref >60)
Glucose, Bld: 111 mg/dL — ABNORMAL HIGH (ref 70–99)
Potassium: 3.8 mmol/L (ref 3.5–5.1)
Sodium: 141 mmol/L (ref 135–145)

## 2021-11-21 MED ORDER — DOXYCYCLINE HYCLATE 100 MG PO TABS
100.0000 mg | ORAL_TABLET | Freq: Two times a day (BID) | ORAL | Status: DC
  Administered 2021-11-21: 100 mg via ORAL
  Filled 2021-11-21: qty 1

## 2021-11-21 MED ORDER — SODIUM CHLORIDE 0.9 % IV SOLN
2.0000 g | INTRAVENOUS | Status: DC
  Administered 2021-11-21: 2 g via INTRAVENOUS
  Filled 2021-11-21: qty 2

## 2021-11-21 MED ORDER — DOXYCYCLINE HYCLATE 100 MG PO TABS
100.0000 mg | ORAL_TABLET | Freq: Two times a day (BID) | ORAL | 0 refills | Status: AC
  Filled 2021-11-21: qty 13, 7d supply, fill #0

## 2021-11-21 NOTE — Assessment & Plan Note (Signed)
Start doxycycline 100 mg twice a day.  This will cover chlamydia conjunctivitis.  This was prescribed for 1 week on discharge.

## 2021-11-21 NOTE — Assessment & Plan Note (Signed)
Case discussed with infectious disease specialist and 1 dose of Rocephin will cover GC conjunctivitis.  Okay to discontinue eyedrops.  Patient stated that he did have a girlfriend over 3 months ago but that ended and he does not have her name or contact information.  The patient did not have any sexual activity in jail.  He is not sexually active with his wife.  I advised him that his partners need to be treated.

## 2021-11-21 NOTE — Discharge Instructions (Addendum)
Any sexual partners need to be treated.   Intensive Outpatient Programs   [facility] [address] [place],  [place], [place] (Inpatient and outpatient)[phone] (Suboxone and Methadone) [address] [phone]  [facility]: [facility] Programs - Intensive Outpatient [place] [address] [place], [place], [place] (Outpatient, Inpatient, [facility] (Groups and [place]idental) (insurance only) [place], [place]   [facility] (for caregivers and family) [address] [place], [place]  Residential Treatment Programs  [place] [facility](2 years) Residential: 49 days)[facility] ([place], [place], [place] or [phone]  [facility] [place], [place], [place]  [place] (RTS) [place], [place], [place] Admissions: 8am-3pm M-F  [facility] Program: Residential Program [phone] Days)             [facility]: [facility]  [place], [place], [place] or [phone] in Hours over the weekend or by referral)   [place] Crisis: [facility]:[phone] (for crisis response 24 hours a day)

## 2021-11-21 NOTE — TOC Progression Note (Signed)
Transition of Care [facility]) - Progression Note    Patient Details  Name: Alan Hampton MRN: [id number] Date of Birth: 1965/11/19  Transition of Care [facility]) CM/SW [place], RN Phone Number: 11/21/2021, 10:25 AM  Clinical Narrative:     Patient comes from home,  SA resources provided and added to his DC paperwork, [facility] application provided [facility] get [facility] at [facility]        Expected Discharge Plan and Services                                                 Social Determinants of Health (SDOH) Interventions    Readmission Risk Interventions     No data to display

## 2021-11-21 NOTE — Discharge Summary (Signed)
Physician Discharge Summary   Patient: Alan Hampton MRN: [id number] DOB: 1965-04-20  Admit date:     11/20/2021  Discharge date: 11/21/21  Discharge Physician: Alford Highland   PCP: Patient, No Pcp Per   Recommendations at discharge:   [facility]  Discharge Diagnoses: Principal Problem:   GC conjunctivitis Active Problems:   Chlamydiae conjunctivitis   Cocaine use   Acute metabolic encephalopathy    Hospital Course: The patient was admitted to the hospital on 11/20/2021 and discharged on 11/21/2021.  The patient came in with acute metabolic encephalopathy.  Patient was having increased drainage from his eyes.  He was started on tobramycin eyedrops.  As per nursing staff the eyes look much better today as compared to yesterday.  The patient's urine gonococcus and chlamydia was positive.  I started the patient on Rocephin and doxycycline.  I spoke with the infectious disease pharmacist and 1 dose of Rocephin would cover GC conjunctivitis.  Completed 1 week of doxycycline.  No need for further eyedrops.  HIV test was negative.  I did order an RPR.  Assessment and Plan: * GC conjunctivitis Case discussed with infectious disease specialist and 1 dose of Rocephin will cover GC conjunctivitis.  Okay to discontinue eyedrops.  Patient stated that he did have a girlfriend over 3 months ago but that ended and he does not have her name or contact information.  The patient did not have any sexual activity in jail.  He is not sexually active with his wife.  I advised him that his partners need to be treated.  Chlamydiae conjunctivitis Start doxycycline 100 mg twice a day.  This will cover chlamydia conjunctivitis.  This was prescribed for 1 week on discharge.  Cocaine use Patient smokes crack cocaine.  I advised that I am seeing patients that develop crack lung and end up on the ventilator.  Advised to stop smoking crack cocaine.  Acute metabolic encephalopathy Improved.          Consultants: None Procedures performed: None Disposition: Home Diet recommendation:  Regular diet DISCHARGE MEDICATION: Allergies as of 11/21/2021   No Known Allergies      Medication List     STOP taking these medications    cephALEXin 500 MG capsule Commonly known as: KEFLEX   ondansetron 4 MG disintegrating tablet Commonly known as: Zofran ODT       TAKE these medications    doxycycline 100 MG tablet Commonly known as: VIBRA-TABS Take 1 tablet (100 mg total) by mouth every 12 (twelve) hours.        Follow-up Information     [facility] [place] [facility] up in 2 week(s).   Specialty: Primary Care Contact information: [address] [place] [address] [phone]               Discharge Exam: Ceasar Mons Weights   11/19/21 2350  Weight: 63.5 kg   Physical Exam HENT:     Head: Normocephalic.     Mouth/Throat:     Pharynx: No oropharyngeal exudate.  Eyes:     Comments: Conjunctiva slightly injected bilaterally.  No pus from eyelid seen.  Cardiovascular:     Rate and Rhythm: Normal rate and regular rhythm.     Heart sounds: Normal heart sounds, S1 normal and S2 normal.  Pulmonary:     Breath sounds: No decreased breath sounds, wheezing, rhonchi or rales.  Abdominal:     Palpations: Abdomen is soft.     Tenderness:  There is no abdominal tenderness.  Musculoskeletal:     Right lower leg: No swelling.     Left lower leg: No swelling.  Skin:    General: Skin is warm.     Findings: No rash.  Neurological:     Mental Status: He is alert and oriented to person, place, and time.      Condition at discharge: stable  The results of significant diagnostics from this hospitalization (including imaging, microbiology, ancillary and laboratory) are listed below for reference.   Imaging Studies: DG Chest Portable 1 View  Result Date: 11/20/2021 CLINICAL DATA:  Shortness of breath. EXAM: PORTABLE CHEST 1 VIEW  COMPARISON:  Chest x-ray 01/08/2021 FINDINGS: The heart size and mediastinal contours are within normal limits. Both lungs are clear. The visualized skeletal structures are unremarkable. IMPRESSION: No active disease. Electronically Signed   By: Ronney Asters M.D.   On: 11/20/2021 01:26   CT HEAD WO CONTRAST (5MM)  Result Date: 11/20/2021 CLINICAL DATA:  Altered mental status EXAM: CT HEAD WITHOUT CONTRAST TECHNIQUE: Contiguous axial images were obtained from the base of the skull through the vertex without intravenous contrast. RADIATION DOSE REDUCTION: This exam was performed according to the departmental dose-optimization program which includes automated exposure control, adjustment of the mA and/or kV according to patient size and/or use of iterative reconstruction technique. COMPARISON:  01/08/2021 FINDINGS: Brain: No evidence of acute infarction, hemorrhage, hydrocephalus, extra-axial collection or mass lesion/mass effect. Vascular: No hyperdense vessel or unexpected calcification. Skull: Normal. Negative for fracture or focal lesion. Sinuses/Orbits: No acute finding. Other: None. IMPRESSION: No acute intracranial abnormality noted. Electronically Signed   By: Inez Catalina M.D.   On: 11/20/2021 01:20    Microbiology: Results for orders placed or performed during the hospital encounter of 11/20/21  SARS Coronavirus 2 by RT PCR (hospital order, performed in [facility] hospital lab) *cepheid single result test* Anterior Nasal Swab     Status: None   Collection Time: 11/20/21 12:52 AM   Specimen: Anterior Nasal Swab  Result Value Ref Range Status   SARS Coronavirus 2 by RT PCR NEGATIVE NEGATIVE Final    Comment: (NOTE) SARS-CoV-2 target nucleic acids are NOT DETECTED.  The SARS-CoV-2 RNA is generally detectable in upper and lower respiratory specimens during the acute phase of infection. The lowest concentration of SARS-CoV-2 viral copies this assay can detect is 250 copies / mL. A negative  result does not preclude SARS-CoV-2 infection and should not be used as the sole basis for treatment or other patient management decisions.  A negative result may occur with improper specimen collection / handling, submission of specimen other than nasopharyngeal swab, presence of viral mutation(s) within the areas targeted by this assay, and inadequate number of viral copies (<250 copies / mL). A negative result must be combined with clinical observations, patient history, and epidemiological information.  Fact Sheet for Patients:   https://www.patel.info/  Fact Sheet for Healthcare Providers: https://hall.com/  This test is not yet approved or  cleared by the [place] FDA and has been authorized for detection and/or diagnosis of SARS-CoV-2 by FDA under an Emergency Use Authorization (EUA).  This EUA will remain in effect (meaning this test can be used) for the duration of the COVID-19 declaration under Section 564(b)(1) of the Act, 21 U.S.C. section 360bbb-3(b)(1), unless the authorization is terminated or revoked sooner.  Performed at [facility], [address], [place], [place] [address]   [place] rt PCR [facility] only)  Status: Abnormal   Collection Time: 11/20/21 12:52 AM   Specimen: Urine  Result Value Ref Range Status   Specimen source GC/Chlam URINE, RANDOM  Final   Chlamydia Tr DETECTED (A) NOT DETECTED Final   N gonorrhoeae DETECTED (A) NOT DETECTED Final    Comment: (NOTE) This CT/NG assay has not been evaluated in patients with a history of  hysterectomy. Performed at [facility], [address] [place], [place] [address]     Labs: CBC: Recent Labs  Lab 11/19/21 2355  WBC 5.2  NEUTROABS 2.2  HGB 11.4*  HCT 35.8*  MCV 94.7  PLT 243   Basic Metabolic Panel: Recent Labs  Lab 11/19/21 2355 11/21/21 0439  NA 143 141  K 3.5 3.8  CL 112* 113*  CO2 25 25  GLUCOSE 130* 111*   BUN 11 11  CREATININE 1.06 1.21  CALCIUM 8.6* 7.8*   Liver Function Tests: Recent Labs  Lab 11/19/21 2355  AST 20  ALT 17  ALKPHOS 61  BILITOT 0.4  PROT 6.9  ALBUMIN 3.6     Discharge time spent: greater than 30 minutes.  Signed: Alford Highland, MD [facility] 11/21/2021

## 2021-11-22 LAB — RPR: RPR Ser Ql: NONREACTIVE

## 2022-08-05 IMAGING — CT CT HEAD W/O CM
3 series · 16 of 47 positions shown, 19 images · non-contrast
Comparison: 12/23/2019

CLINICAL DATA: Altered mental status.

EXAM:
CT HEAD WITHOUT CONTRAST
TECHNIQUE: Contiguous axial images were obtained from the base of the skull
through the vertex without intravenous contrast.

[Series 2: head wo · axial · 0.43mm/px · z∈[-54,+71]mm · 10 of 31 slices shown, 13 images]
[im 3/31  brain]
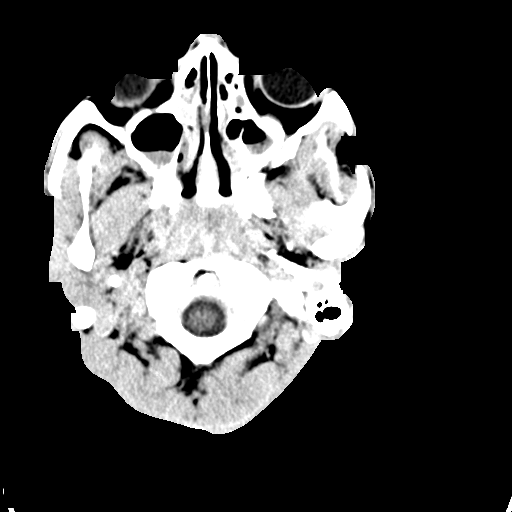
[im 3/31  bone]
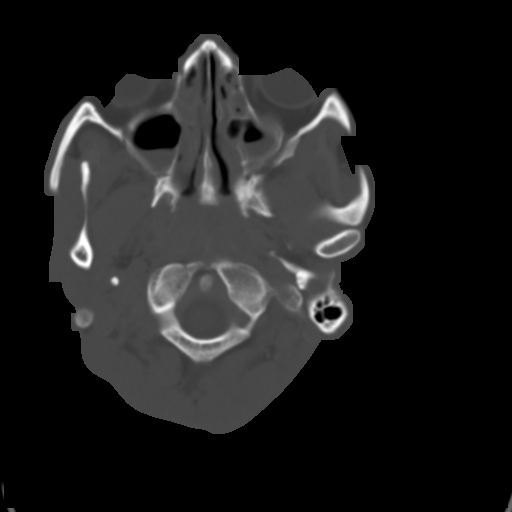
[im 6/31  brain]
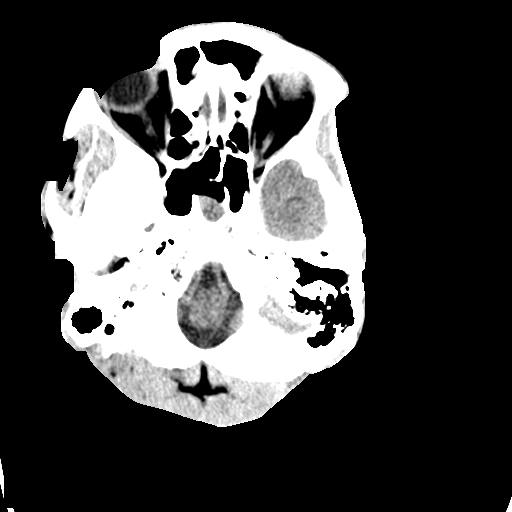
[im 9/31  brain]
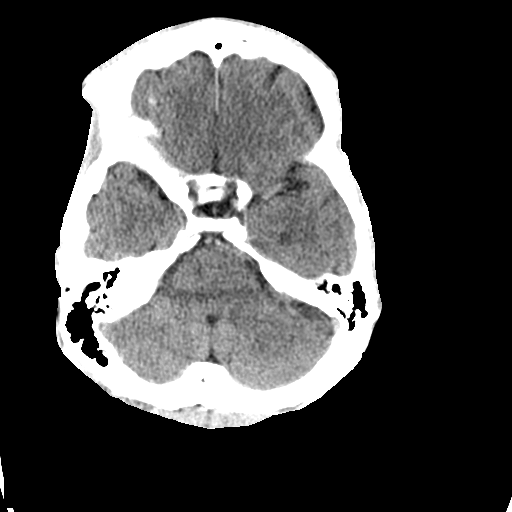
[im 11/31  brain]
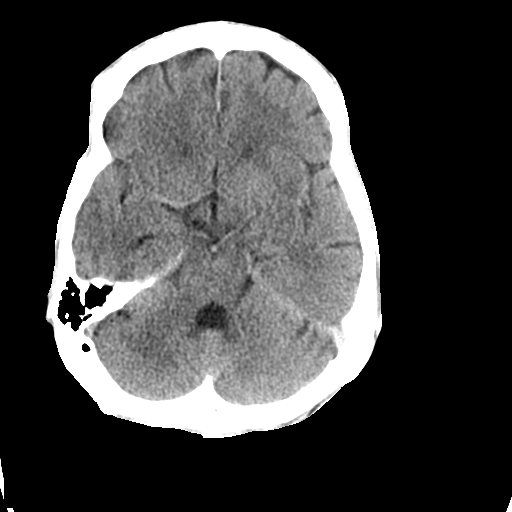
[im 14/31  brain]
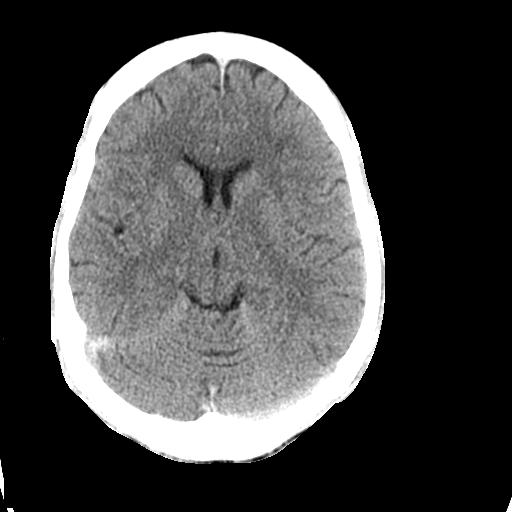
[im 14/31  bone]
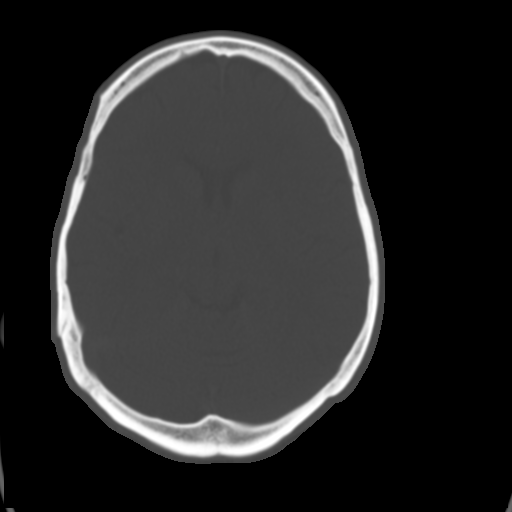
[im 17/31  brain]
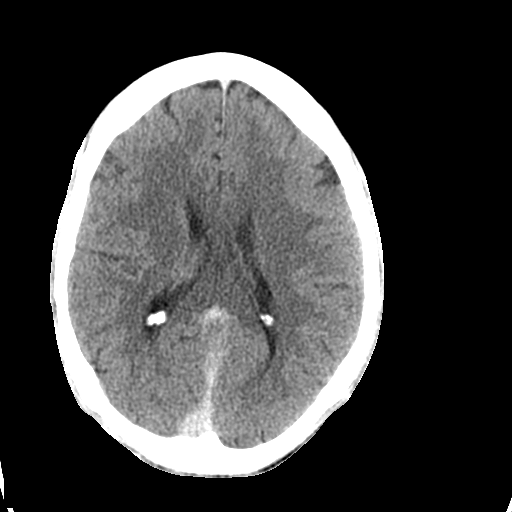
[im 20/31  brain]
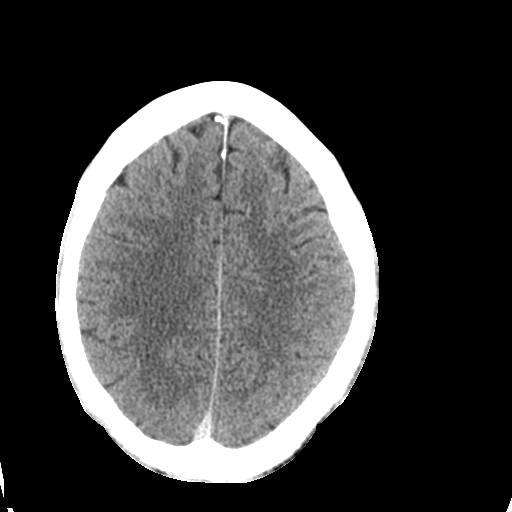
[im 23/31  brain]
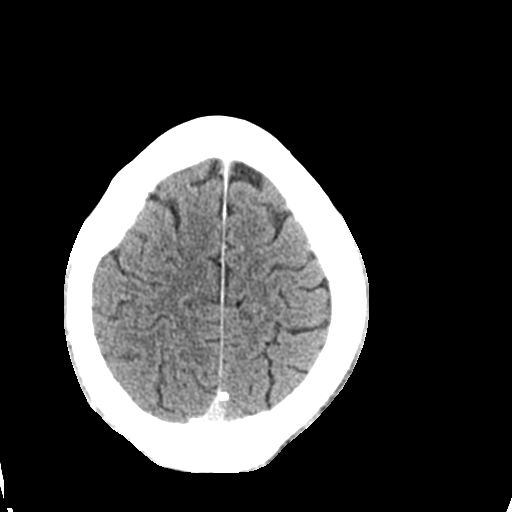
[im 25/31  brain]
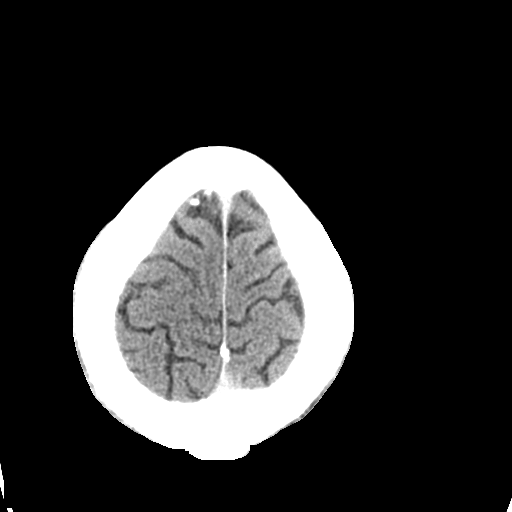
[im 25/31  bone]
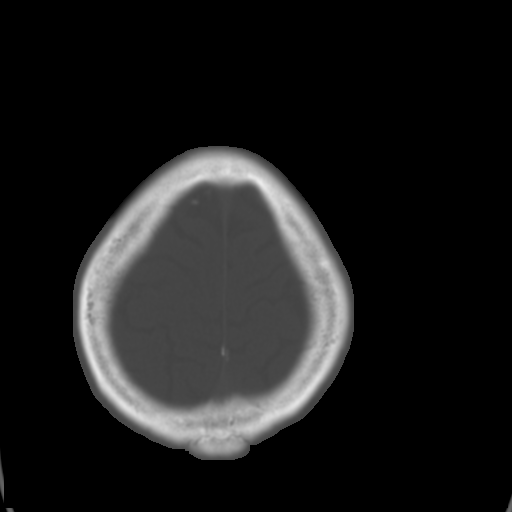
[im 28/31  brain]
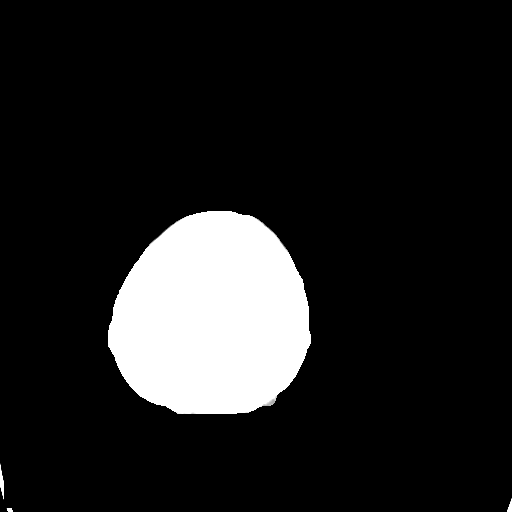

[Series 4: coronal soft tissue · coronal · 0.32mm/px · 3 of 65 slices shown]
[im 22/65  brain]
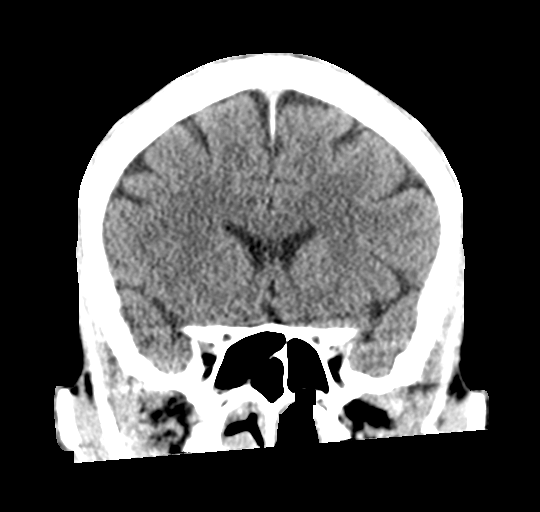
[im 29/65  brain]
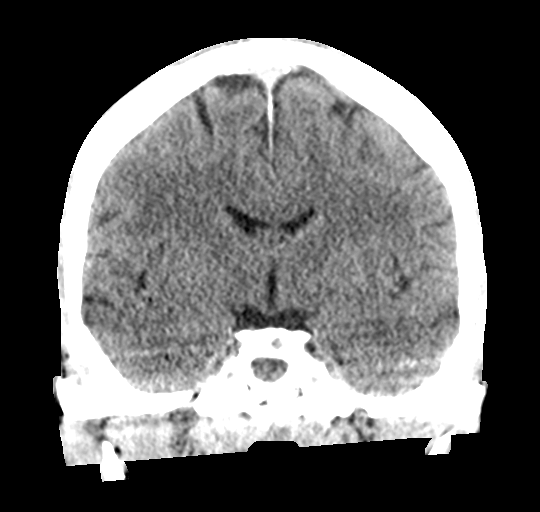
[im 36/65  brain]
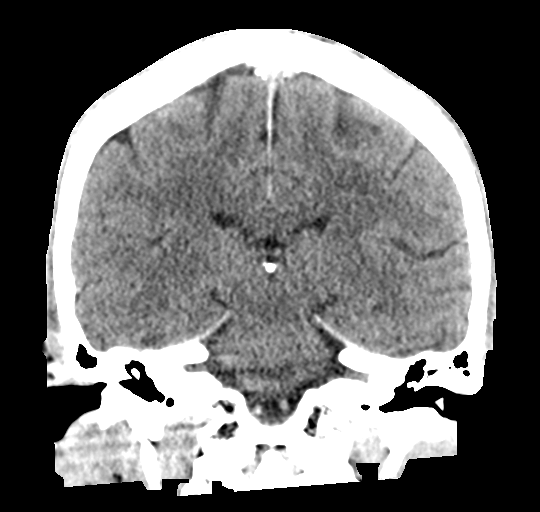

[Series 5: sagittal soft tissue · sagittal · 0.32mm/px · 3 of 58 slices shown]
[im 20/58  brain]
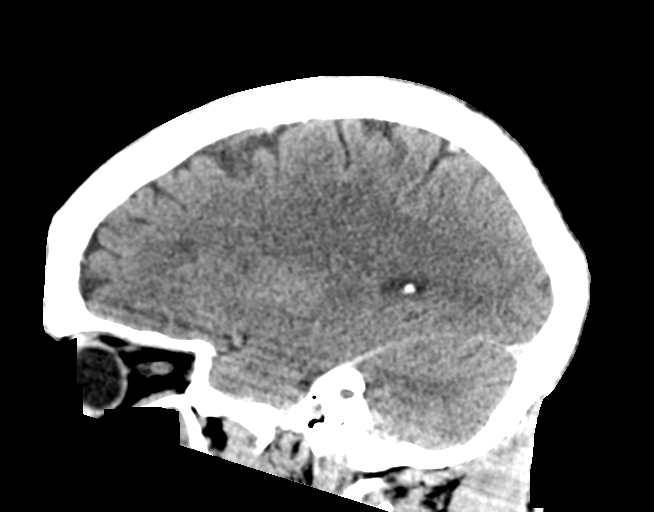
[im 29/58  brain]
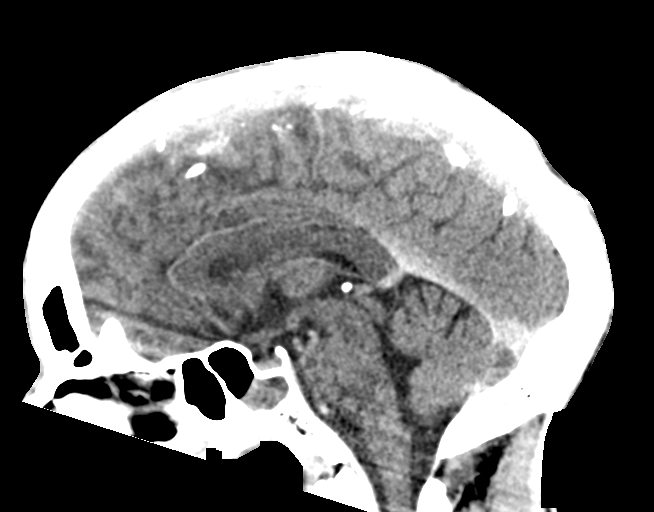
[im 39/58  brain]
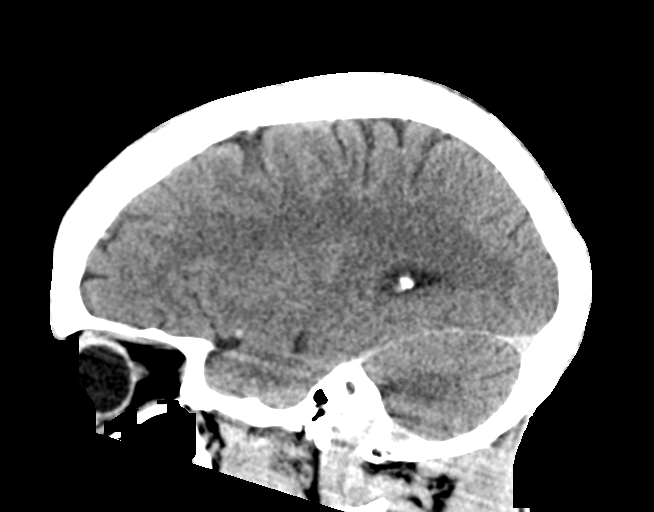

[16 of 47 positions shown; findings below may reference images not displayed]

FINDINGS: Brain: No evidence of acute infarction, hemorrhage, hydrocephalus,
extra-axial collection, or mass lesion/mass effect.

Vascular:  No hyperdense vessel or other acute findings.

Skull: No evidence of fracture or other significant bone
abnormality.

Sinuses/Orbits: Air-fluid levels are seen in the bilateral
maxillary, ethmoid, and sphenoid sinuses, suspicious for acute
sinusitis.

Other: None.
IMPRESSION: No intracranial abnormality identified.

Bilateral maxillary, ethmoid, and sphenoid sinus air-fluid levels,
suspicious for acute sinusitis.

## 2022-08-05 IMAGING — CR DG CHEST 2V
2 series · 2 of 2 positions shown · non-contrast
Comparison: 06/08/2020 chest radiograph.

CLINICAL DATA: Fever, cough

EXAM:
CHEST - 2 VIEW

[chest lat]
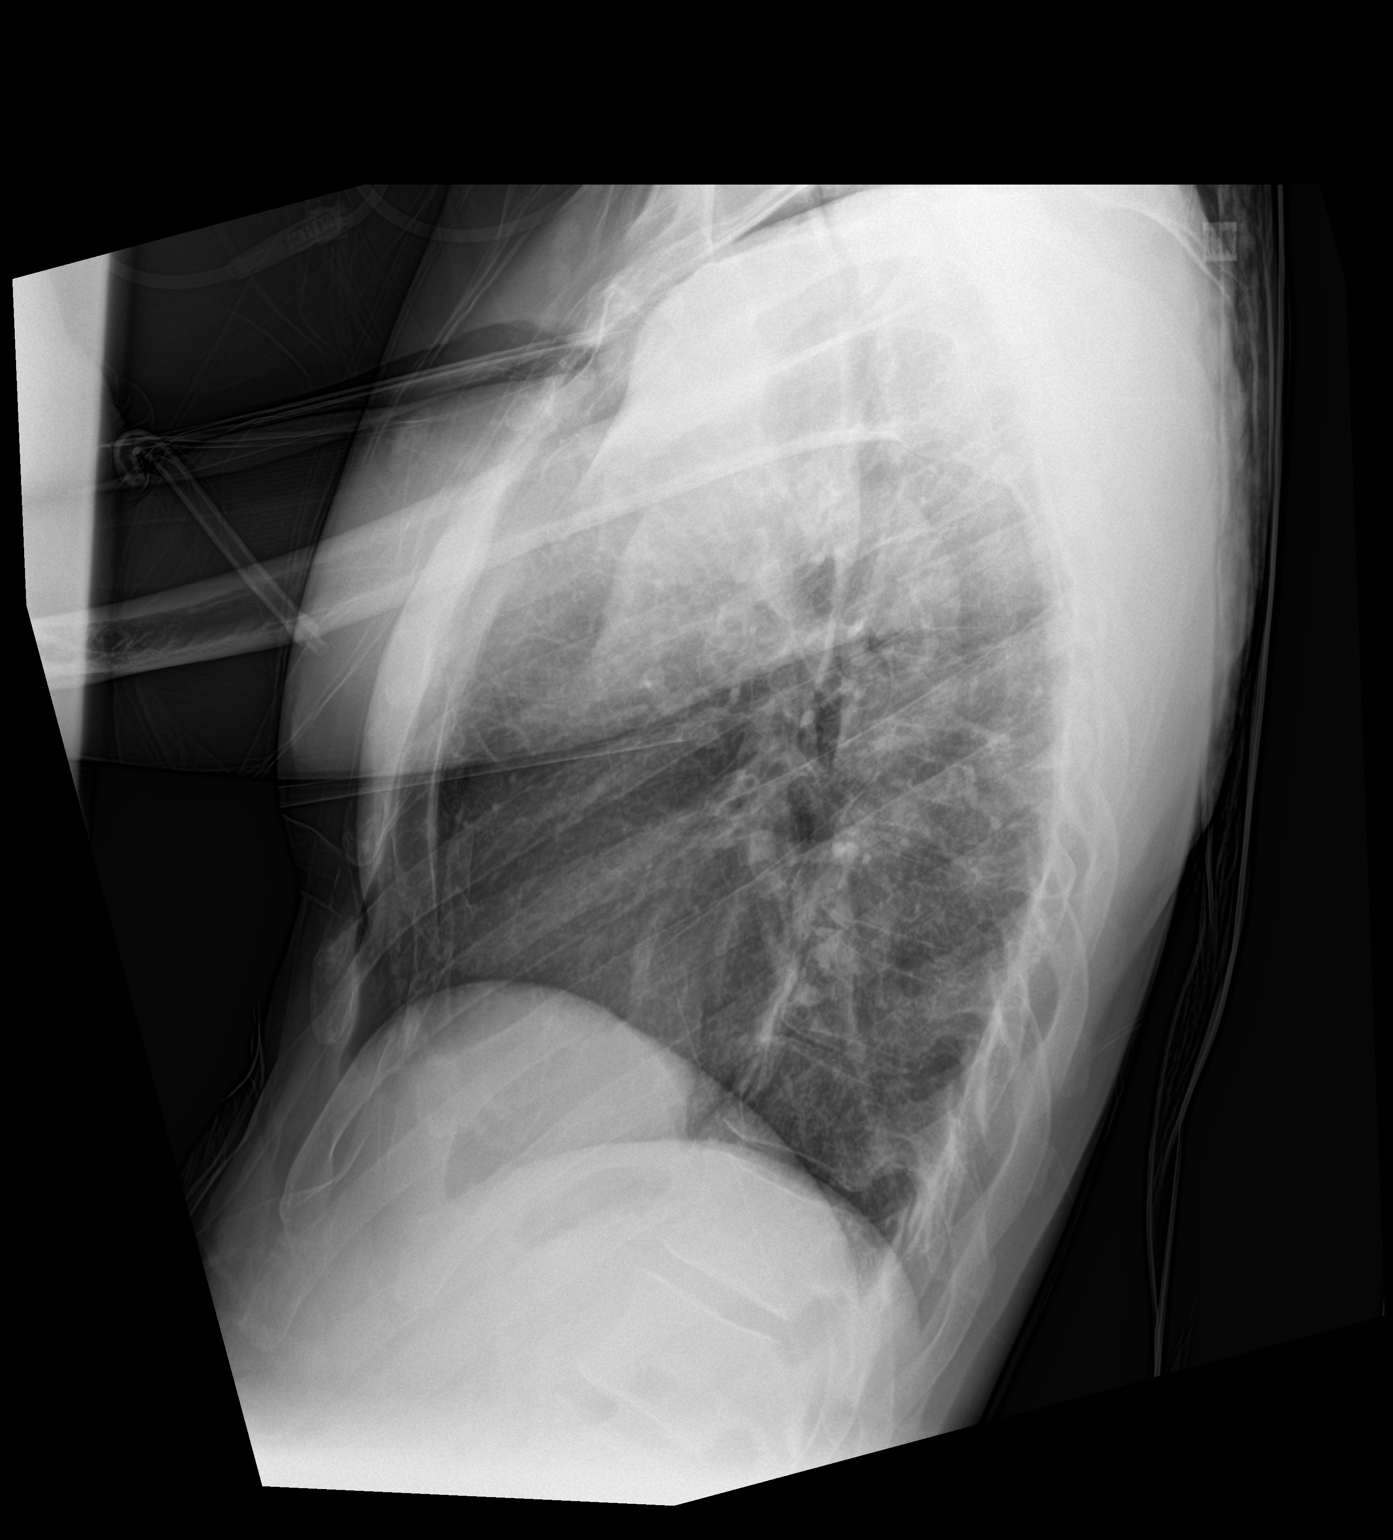

[chest ap]
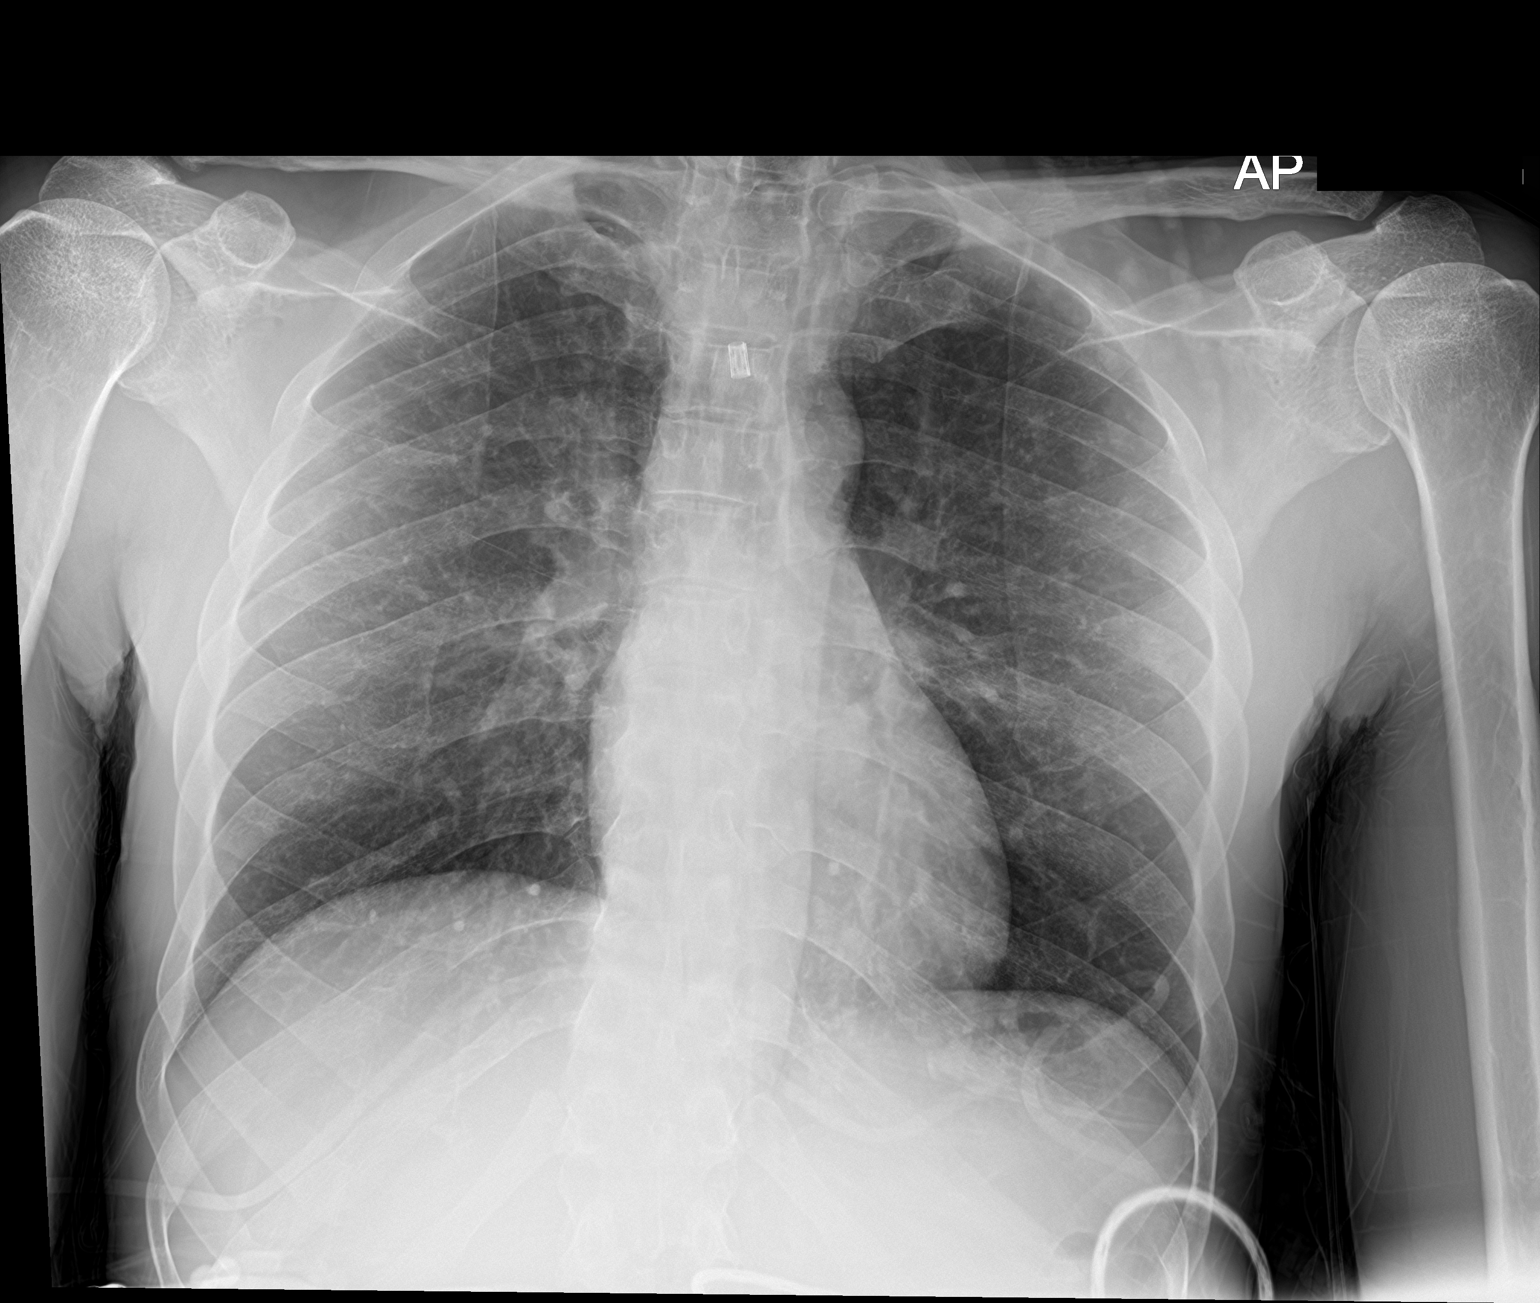

[2 of 2 positions shown; findings below may reference images not displayed]

FINDINGS: Stable cardiomediastinal silhouette with normal heart size. No
pneumothorax. No pleural effusion. Lungs appear clear, with no acute
consolidative airspace disease and no pulmonary edema.
IMPRESSION: No active cardiopulmonary disease.

## 2023-07-15 ENCOUNTER — Emergency Department
Admission: EM | Admit: 2023-07-15 | Discharge: 2023-07-15 | Disposition: A | Discharge status: Home / Self Care | Payer: Self-pay | Attending: Emergency Medicine | Admitting: Emergency Medicine

## 2023-07-15 ENCOUNTER — Other Ambulatory Visit: Payer: Self-pay

## 2023-07-15 DIAGNOSIS — S31119A Laceration without foreign body of abdominal wall, unspecified quadrant without penetration into peritoneal cavity, initial encounter: Secondary | ICD-10-CM | POA: Insufficient documentation

## 2023-07-15 DIAGNOSIS — Z23 Encounter for immunization: Secondary | ICD-10-CM | POA: Insufficient documentation

## 2023-07-15 DIAGNOSIS — S30810A Abrasion of lower back and pelvis, initial encounter: Secondary | ICD-10-CM | POA: Insufficient documentation

## 2023-07-15 LAB — COMPREHENSIVE METABOLIC PANEL WITH GFR
ALT: 19 U/L (ref 0–44)
AST: 35 U/L (ref 15–41)
Albumin: 3.6 g/dL (ref 3.5–5.0)
Alkaline Phosphatase: 61 U/L (ref 38–126)
Anion gap: 7 (ref 5–15)
BUN: 14 mg/dL (ref 6–20)
CO2: 24 mmol/L (ref 22–32)
Calcium: 8.7 mg/dL — ABNORMAL LOW (ref 8.9–10.3)
Chloride: 108 mmol/L (ref 98–111)
Creatinine, Ser: 0.95 mg/dL (ref 0.61–1.24)
GFR, Estimated: >60 mL/min (ref >60)
Glucose, Bld: 94 mg/dL (ref 70–99)
Potassium: 3.8 mmol/L (ref 3.5–5.1)
Sodium: 139 mmol/L (ref 135–145)
Total Bilirubin: 0.8 mg/dL (ref 0.0–1.2)
Total Protein: 6.6 g/dL (ref 6.5–8.1)

## 2023-07-15 LAB — CBC WITH DIFFERENTIAL/PLATELET
Abs Immature Granulocytes: 0 10*3/uL (ref 0.00–0.07)
Basophils Absolute: 0 10*3/uL (ref 0.0–0.1)
Basophils Relative: 0 %
Eosinophils Absolute: 0.2 10*3/uL (ref 0.0–0.5)
Eosinophils Relative: 5 %
HCT: 35.7 % — ABNORMAL LOW (ref 39.0–52.0)
Hemoglobin: 11.7 g/dL — ABNORMAL LOW (ref 13.0–17.0)
Immature Granulocytes: 0 %
Lymphocytes Relative: 37 %
Lymphs Abs: 1.7 10*3/uL (ref 0.7–4.0)
MCH: 30.8 pg (ref 26.0–34.0)
MCHC: 32.8 g/dL (ref 30.0–36.0)
MCV: 93.9 fL (ref 80.0–100.0)
Monocytes Absolute: 0.6 10*3/uL (ref 0.1–1.0)
Monocytes Relative: 12 %
Neutro Abs: 2 10*3/uL (ref 1.7–7.7)
Neutrophils Relative %: 46 %
Platelets: 150 10*3/uL (ref 150–400)
RBC: 3.8 MIL/uL — ABNORMAL LOW (ref 4.22–5.81)
RDW: 12.3 % (ref 11.5–15.5)
WBC: 4.5 10*3/uL (ref 4.0–10.5)
nRBC: 0 % (ref 0.0–0.2)

## 2023-07-15 LAB — ETHANOL: Alcohol, Ethyl (B): <15 mg/dL (ref <15)

## 2023-07-15 MED ORDER — TETANUS-DIPHTH-ACELL PERTUSSIS 5-2.5-18.5 LF-MCG/0.5 IM SUSY
0.5000 mL | PREFILLED_SYRINGE | Freq: Once | INTRAMUSCULAR | Status: AC
  Administered 2023-07-15: 0.5 mL via INTRAMUSCULAR
  Filled 2023-07-15: qty 0.5

## 2023-07-15 NOTE — ED Triage Notes (Signed)
 Per EMS  Lacerations to back Stabbed with scissors Superficial

## 2023-07-15 NOTE — ED Notes (Addendum)
 This RN and Dr. Karlynn Oyster at bedside, pt has superficial abrasion on his back and abrasion to the L lower rib area. Dermabond applied to the area on the L rib area
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
## 2023-07-15 NOTE — Discharge Instructions (Addendum)
 Skin glue was applied to the area on your left flank.  Otherwise you can use soap and water for the other cuts no additional treatment needed.
